# Patient Record
Sex: Male | Born: 1965 | Race: White | Hispanic: No | Marital: Married
Health system: Midwestern US, Community
[De-identification: ages and names within clinical notes are randomized; demographics above are authoritative.]

## PROBLEM LIST (undated history)

## (undated) DIAGNOSIS — M23304 Other meniscus derangements, unspecified medial meniscus, left knee: Secondary | ICD-10-CM

## (undated) DIAGNOSIS — Z79899 Other long term (current) drug therapy: Secondary | ICD-10-CM

## (undated) DIAGNOSIS — S83242D Other tear of medial meniscus, current injury, left knee, subsequent encounter: Secondary | ICD-10-CM

## (undated) DIAGNOSIS — R251 Tremor, unspecified: Secondary | ICD-10-CM

---

## 2017-01-14 NOTE — Other (Signed)
[facility] [address] [place] [id number] [phone]      [facility] RECORD    Patient Name: George Norman , George Norman Medical Record Number: [id number]   Account Number: [id number] DOB: 06 / 09 / 1967   Admit Date: 02 / 25 / 2018 Discharge Date: / /         The patient is a 51 year old male who comes in chief complaint of left ear pain.  The patient states that the pain started this afternoon with an acute onset.  He denies any previous upper respiratory tract symptoms.  He denies any fever, chills, or night sweats.  He has been out snow shoveling today.    PAST MEDICAL HISTORY:  Significant for no known drug allergies; currently taking no medications.    OBJECTIVE:  VITAL SIGNS:  Temperature 36.9, pulse 57, respirations 20, blood pressure 111/68, pulse ox 99 percent.   HEENT:  TMs are intact.  They are nonerythematous and non-bulging.  He is mildly tender along the insertion of the sternocleidomastoid on the left right at the mastoid process.  He has tenderness with lateral rotation, as well as with lateral flexion.  Posterior pharynx is not injected.     NECK:  Otherwise supple.   LUNGS:  Clear to auscultation in all fields.   HEART:  Has a regular rate and rhythm with no murmurs, thrills, rubs, or gallops.    ASSESSMENT:  Musculoskeletal strain at the sternocleidomastoid at the left mastoid process.    PLAN:  At this time, I will provide him with some etodolac 300 mg, 1 tablet 3 times daily for 5 days.  The patient was advised regarding neck stretching exercises, to expect a gradual decrease in pain over the next 3 to 4 days.  The patient understood those instructions.          ____________________________________  Carmell AustriaJames H. Mycala Warshawsky, MD    JK/MODL  D:  01/14/2017 18:36:38  T:  01/14/2017 18:46:57  MModal Job#: [id number]  Doc#:  [id number]    CC: Doug SouYAN GILBERT  Electronically Authenticated by:  Gearldine ShownJames Keshia Weare, MD On 01/23/2017 09:33 AM EST

## 2018-01-24 ENCOUNTER — Encounter

## 2018-01-24 ENCOUNTER — Inpatient Hospital Stay: Admit: 2018-01-24 | Payer: MEDICARE

## 2018-01-24 DIAGNOSIS — R251 Tremor, unspecified: Secondary | ICD-10-CM

## 2018-01-24 LAB — CBC WITH AUTOMATED DIFF
ABS. BASOPHILS: 0 10*3/uL (ref 0.0–0.1)
ABS. EOSINOPHILS: 0.1 10*3/uL (ref 0.0–0.5)
ABS. IMM. GRANS.: 0 10*3/uL (ref 0–0.03)
ABS. LYMPHOCYTES: 1.8 10*3/uL (ref 1.2–3.7)
ABS. MONOCYTES: 0.6 10*3/uL (ref 0.2–0.8)
ABS. NEUTROPHILS: 3.8 10*3/uL (ref 1.6–6.1)
BASOPHILS: 1 % (ref 0–1.2)
EOSINOPHILS: 1 %
HCT: 44.4 % (ref 40.1–51.0)
HGB: 15.1 g/dL (ref 13.7–17.5)
IMMATURE GRANULOCYTES: 0 % (ref 0–0.4)
LYMPHOCYTES: 28 %
MCH: 32.8 PG — ABNORMAL HIGH (ref 25.6–32.2)
MCHC: 34 g/dL (ref 32.2–35.5)
MCV: 96.3 FL — ABNORMAL HIGH (ref 80.0–95.0)
MONOCYTES: 10 %
MPV: 11 FL (ref 9.4–12.4)
NEUTROPHILS: 60 %
PLATELET: 147 10*3/uL — ABNORMAL LOW (ref 150–400)
RBC: 4.61 M/uL (ref 4.51–5.93)
RDW: 12.8 % (ref 11.6–14.4)
WBC: 6.2 10*3/uL (ref 4.0–10.0)

## 2018-01-24 LAB — METABOLIC PANEL, COMPREHENSIVE
A-G Ratio: 1 (ref 1.0–3.0)
ALT (SGPT): 28 U/L (ref 16–61)
AST (SGOT): 17 U/L (ref 15–37)
Albumin: 3.6 g/dL (ref 3.4–5.0)
Alk. phosphatase: 59 U/L (ref 45–117)
Anion gap: 4 mmol/L — ABNORMAL LOW (ref 5–15)
BUN/Creatinine ratio: 31 — ABNORMAL HIGH (ref 12–20)
BUN: 25 MG/DL — ABNORMAL HIGH (ref 7–18)
Bilirubin, total: 0.56 mg/dL (ref 0.20–1.20)
CO2: 30 mmol/L (ref 21–32)
Calcium: 8.9 MG/DL (ref 8.5–10.1)
Chloride: 105 mmol/L (ref 98–110)
Creatinine: 0.81 MG/DL (ref 0.70–1.30)
GFR est AA: >60 mL/min/{1.73_m2} (ref >60)
GFR est non-AA: >60 mL/min/{1.73_m2} (ref >60)
Globulin: 3.6 g/dL — ABNORMAL HIGH (ref 2.5–3.5)
Glucose: 92 mg/dL (ref 70–100)
Potassium: 4.3 mmol/L (ref 3.5–5.1)
Protein, total: 7.2 g/dL (ref 6.4–8.2)
Sodium: 139 mmol/L (ref 136–145)

## 2018-01-24 LAB — URINALYSIS W/ RFLX MICROSCOPIC
Bilirubin: NEGATIVE
Blood: NEGATIVE
Glucose: NEGATIVE mg/dL
Ketone: NEGATIVE mg/dL
Leukocyte Esterase: NEGATIVE
Nitrites: NEGATIVE
Protein: NEGATIVE mg/dL
Specific gravity: 1.027 (ref 1.001–1.035)
Urobilinogen: 2 EU/dL — ABNORMAL HIGH (ref 0.2–1.0)
pH (UA): 7 (ref 5–8)

## 2018-01-24 LAB — SED RATE, AUTOMATED: Sed rate, automated: <1 MM/HR (ref 0–15)

## 2018-01-24 LAB — C REACTIVE PROTEIN, QT: C-Reactive protein: <0.29 mg/dL (ref <0.30)

## 2018-01-24 LAB — AMMONIA: Ammonia, plasma: 12 umol/L (ref 11–32)

## 2018-01-24 LAB — T4, FREE: T4, Free: 1.2 ng/dL (ref 0.76–1.46)

## 2018-01-24 LAB — FOLATE: Folate: >20 ng/mL — ABNORMAL HIGH (ref 3.1–17.5)

## 2018-01-24 LAB — VITAMIN B12: Vitamin B12: 1091 pg/mL — ABNORMAL HIGH (ref 211.0–911.0)

## 2018-01-24 LAB — TSH 3RD GENERATION: TSH, 3rd generation: 3.54 u[IU]/mL (ref 0.358–3.740)

## 2018-01-24 LAB — VITAMIN D, 25 HYDROXY: VITAMIN D, 25-HYDROXY TOTAL: 36.4 ng/mL

## 2018-01-24 LAB — VALPROIC ACID: Valproic acid: 126 ug/ml — ABNORMAL HIGH (ref 50.0–100.0)

## 2018-01-24 LAB — RHEUMATOID FACTOR, QT: Rheumatoid factor: <10 IU/mL (ref <15)

## 2018-01-25 LAB — LYME DISEASE SEROLOGY EVALUATION(ELISA): LYME DISEASE SEROLOGY: NEGATIVE

## 2018-01-26 LAB — VITAMIN B1, WHOLE BLOOD: VITAMIN B1, WHOLE BLOOD: 176 nmol/L (ref 70–180)

## 2018-01-28 LAB — PYRIDOXAL 5-PHOSPHATE: Pyridoxal 5-Phosphate (PLP): 6 mcg/L (ref 5–50)

## 2018-01-28 LAB — METHYLMALONIC ACID: METHYLMALONIC ACID, SERUM: 0.14 nmol/mL (ref <=0.40)

## 2018-01-29 LAB — ANTINUCLEAR AB (ANA): ANA: NEGATIVE

## 2018-02-07 ENCOUNTER — Inpatient Hospital Stay: Admit: 2018-02-07 | Payer: MEDICARE

## 2018-02-07 ENCOUNTER — Encounter

## 2018-02-07 DIAGNOSIS — Z79899 Other long term (current) drug therapy: Secondary | ICD-10-CM

## 2018-02-07 LAB — METABOLIC PANEL, COMPREHENSIVE
A-G Ratio: 0.9 — ABNORMAL LOW (ref 1.0–3.0)
ALT (SGPT): 28 U/L (ref 16–61)
AST (SGOT): 17 U/L (ref 15–37)
Albumin: 3.5 g/dL (ref 3.4–5.0)
Alk. phosphatase: 58 U/L (ref 45–117)
Anion gap: 5 mmol/L (ref 5–15)
BUN/Creatinine ratio: 29 — ABNORMAL HIGH (ref 12–20)
BUN: 23 MG/DL — ABNORMAL HIGH (ref 7–18)
Bilirubin, total: 0.5 mg/dL (ref 0.20–1.20)
CO2: 32 mmol/L (ref 21–32)
Calcium: 9.1 MG/DL (ref 8.5–10.1)
Chloride: 103 mmol/L (ref 98–110)
Creatinine: 0.79 MG/DL (ref 0.70–1.30)
GFR est AA: >60 mL/min/{1.73_m2} (ref >60)
GFR est non-AA: >60 mL/min/{1.73_m2} (ref >60)
Globulin: 3.7 g/dL — ABNORMAL HIGH (ref 2.5–3.5)
Glucose: 87 mg/dL (ref 70–100)
Potassium: 4.4 mmol/L (ref 3.5–5.1)
Protein, total: 7.2 g/dL (ref 6.4–8.2)
Sodium: 140 mmol/L (ref 136–145)

## 2018-02-07 LAB — CBC WITH AUTOMATED DIFF
ABS. BASOPHILS: 0.1 10*3/uL (ref 0.0–0.1)
ABS. EOSINOPHILS: 0.1 10*3/uL (ref 0.0–0.5)
ABS. IMM. GRANS.: 0.1 10*3/uL — ABNORMAL HIGH (ref 0–0.03)
ABS. LYMPHOCYTES: 2.2 10*3/uL (ref 1.2–3.7)
ABS. MONOCYTES: 0.7 10*3/uL (ref 0.2–0.8)
ABS. NEUTROPHILS: 3.3 10*3/uL (ref 1.6–6.1)
BASOPHILS: 1 % (ref 0–1.2)
EOSINOPHILS: 2 %
HCT: 44.7 % (ref 40.1–51.0)
HGB: 14.8 g/dL (ref 13.7–17.5)
IMMATURE GRANULOCYTES: 1 % — ABNORMAL HIGH (ref 0–0.4)
LYMPHOCYTES: 34 %
MCH: 31.8 PG (ref 25.6–32.2)
MCHC: 33.1 g/dL (ref 32.2–35.5)
MCV: 96.1 FL — ABNORMAL HIGH (ref 80.0–95.0)
MONOCYTES: 11 %
MPV: 10.8 FL (ref 9.4–12.4)
NEUTROPHILS: 51 %
PLATELET: 177 10*3/uL (ref 150–400)
RBC: 4.65 M/uL (ref 4.51–5.93)
RDW: 13 % (ref 11.6–14.4)
WBC: 6.4 10*3/uL (ref 4.0–10.0)

## 2018-02-07 LAB — VALPROIC ACID
Reported dose time:: 2330
Valproic acid: 84 ug/ml (ref 50.0–100.0)

## 2018-03-24 ENCOUNTER — Inpatient Hospital Stay
Admit: 2018-03-24 | Discharge: 2018-03-24 | Disposition: A | Discharge status: Other Acute Inpatient Hospital | Payer: MEDICARE | Attending: Registered"

## 2018-03-24 ENCOUNTER — Inpatient Hospital Stay: Admit: 2018-03-24 | Payer: MEDICARE | Attending: Registered"

## 2018-03-24 ENCOUNTER — Inpatient Hospital Stay
Admit: 2018-03-24 | Discharge: 2018-03-25 | Disposition: A | Discharge status: Home / Self Care | Payer: MEDICARE | Attending: Emergency Medicine

## 2018-03-24 DIAGNOSIS — J069 Acute upper respiratory infection, unspecified: Secondary | ICD-10-CM

## 2018-03-24 LAB — CBC WITH AUTOMATED DIFF
ABS. BASOPHILS: 0 10*3/uL (ref 0.0–0.1)
ABS. EOSINOPHILS: 0.1 10*3/uL (ref 0.0–0.5)
ABS. IMM. GRANS.: 0 10*3/uL (ref 0.00–0.03)
ABS. LYMPHOCYTES: 1.9 10*3/uL (ref 1.2–3.7)
ABS. MONOCYTES: 0.7 10*3/uL (ref 0.2–0.8)
ABS. NEUTROPHILS: 3.7 10*3/uL (ref 1.6–6.1)
BASOPHILS: 1 % (ref 0–1.2)
EOSINOPHILS: 1 %
HCT: 39.3 % — ABNORMAL LOW (ref 40.1–51.0)
HGB: 13.2 g/dL — ABNORMAL LOW (ref 13.7–17.5)
IMMATURE GRANULOCYTES: 0 % (ref 0.0–0.4)
LYMPHOCYTES: 30 %
MCH: 31.5 PG (ref 25.6–32.2)
MCHC: 33.6 g/dL (ref 32.2–35.5)
MCV: 93.8 FL (ref 80.0–95.0)
MONOCYTES: 11 %
MPV: 10 FL (ref 9.4–12.4)
NEUTROPHILS: 57 %
PLATELET: 189 10*3/uL (ref 150–400)
RBC: 4.19 M/uL — ABNORMAL LOW (ref 4.51–5.93)
RDW: 11.9 % (ref 11.6–14.4)
WBC: 6.4 10*3/uL (ref 4.0–10.0)

## 2018-03-24 LAB — METABOLIC PANEL, COMPREHENSIVE
A-G Ratio: 1.1 (ref 1.0–3.0)
ALT (SGPT): 29 U/L (ref 16–61)
AST (SGOT): 22 U/L (ref 15–37)
Albumin: 3.4 g/dL (ref 3.4–5.0)
Alk. phosphatase: 65 U/L (ref 45–117)
Anion gap: 7 mmol/L (ref 5–15)
BUN/Creatinine ratio: 33 — ABNORMAL HIGH (ref 12–20)
BUN: 23 MG/DL — ABNORMAL HIGH (ref 7–18)
Bilirubin, total: 0.33 mg/dL (ref 0.20–1.20)
CO2: 30 mmol/L (ref 21–32)
Calcium: 8.4 MG/DL — ABNORMAL LOW (ref 8.5–10.1)
Chloride: 104 mmol/L (ref 98–110)
Creatinine: 0.69 MG/DL — ABNORMAL LOW (ref 0.70–1.30)
GFR est AA: >60 mL/min/{1.73_m2} (ref >60)
GFR est non-AA: >60 mL/min/{1.73_m2} (ref >60)
Globulin: 3.2 g/dL (ref 2.5–3.5)
Glucose: 87 mg/dL (ref 70–100)
Potassium: 3.6 mmol/L (ref 3.5–5.1)
Protein, total: 6.6 g/dL (ref 6.4–8.2)
Sodium: 141 mmol/L (ref 136–145)

## 2018-03-24 LAB — UA WITH REFLEX MICRO AND CULTURE
Blood: NEGATIVE
Glucose: NEGATIVE mg/dL
Leukocyte Esterase: NEGATIVE
Nitrites: NEGATIVE
Protein: NEGATIVE mg/dL
Specific gravity: 1.033 (ref 1.001–1.035)
Urobilinogen: 1 EU/dL (ref 0.2–1.0)
pH (UA): 5.5 (ref 5–8)

## 2018-03-24 LAB — TROPONIN I: Troponin-I, Qt.: <0.015 ng/mL (ref 0.000–0.040)

## 2018-03-24 NOTE — ED Provider Notes (Addendum)
This 52 year old white male was referred to the emergency room from the [facility] for further evaluation of his coughing and abdominal discomfort.  The patient and his wife have had an upper respiratory tract infection over the last 7-9 days with coughing as well as nasal congestion and some frontal headaches.  The patient states every other day after eating a heavy meal he will have some lower infra umbilical abdominal discomfort leading to a bout of vomiting which resolved his pain. He has no abdominal pains now.  He is without chest pain, pleurisy, fevers, hemoptysis, black or bloody stools, hematuria or wheezing.  He does not smoke.  He occasionally drinks alcohol.  His weight is stable.  He is denying calf pain or swelling.  He is without orthopnea.  He denies a sore throat.           Past Medical History:   Diagnosis Date   ??? Seizures (HCC)        Past Surgical History:   Procedure Laterality Date   ??? HX OTHER SURGICAL      right hand         No family history on file.    Social History     Socioeconomic History   ??? Marital status: MARRIED     Spouse name: Not on file   ??? Number of children: Not on file   ??? Years of education: Not on file   ??? Highest education level: Not on file   Occupational History   ??? Not on file   Social Needs   ??? Financial resource strain: Not on file   ??? Food insecurity:     Worry: Not on file     Inability: Not on file   ??? Transportation needs:     Medical: Not on file     Non-medical: Not on file   Tobacco Use   ??? Smoking status: Never Smoker   ??? Smokeless tobacco: Never Used   Substance and Sexual Activity   ??? Alcohol use: Yes     Comment: occassionally   ??? Drug use: Never   ??? Sexual activity: Not on file   Lifestyle   ??? Physical activity:     Days per week: Not on file     Minutes per session: Not on file   ??? Stress: Not on file   Relationships   ??? Social connections:     Talks on phone: Not on file     Gets together: Not on file      Attends religious service: Not on file     Active member of club or organization: Not on file     Attends meetings of clubs or organizations: Not on file     Relationship status: Not on file   ??? Intimate partner violence:     Fear of current or ex partner: Not on file     Emotionally abused: Not on file     Physically abused: Not on file     Forced sexual activity: Not on file   Other Topics Concern   ??? Not on file   Social History Narrative   ??? Not on file         ALLERGIES: Patient has no known allergies.    Review of Systems   Constitutional: Negative.  Negative for fever.   HENT: Positive for postnasal drip, rhinorrhea and sinus pressure. Negative for sore throat, trouble swallowing and voice change.    Eyes:  Negative.    Respiratory: Positive for cough. Negative for chest tightness, shortness of breath and wheezing.    Gastrointestinal: Positive for abdominal pain, nausea and vomiting. Negative for blood in stool and diarrhea.   Endocrine: Negative.    Genitourinary: Negative.    Musculoskeletal: Negative.    Allergic/Immunologic: Negative.    Neurological: Negative.    Hematological: Negative.    Psychiatric/Behavioral: Negative.    All other systems reviewed and are negative.      Vitals:    03/24/18 1800   BP: 122/73   Pulse: (!) 47   Resp: 14   Temp: 97.9 ??F (36.6 ??C)   SpO2: 98%   Weight: 73.9 kg (163 lb)   Height:  (1.803 m)            Physical Exam   Constitutional: He is oriented to person, place, and time. He appears well-developed and well-nourished.  Non-toxic appearance. He does not appear ill.   HENT:   Head: Normocephalic and atraumatic.   Mouth/Throat: Oropharynx is clear and moist.   Eyes: Pupils are equal, round, and reactive to light. EOM are normal.   Cardiovascular: Normal rate, regular rhythm, normal heart sounds and intact distal pulses.   Pulmonary/Chest: Effort normal and breath sounds normal.   Abdominal: Soft. Normal appearance, normal aorta and bowel sounds are  normal. There is no splenomegaly or hepatomegaly. There is no tenderness. There is no rigidity, no rebound and no guarding. No hernia.   Neurological: He is alert and oriented to person, place, and time.   Skin: Skin is warm and dry. Capillary refill takes less than 2 seconds. No rash noted.   Psychiatric: He has a normal mood and affect. His behavior is normal.   Nursing note and vitals reviewed.       MDM  Number of Diagnoses or Management Options  Non-intractable vomiting without nausea, unspecified vomiting type:   Viral upper respiratory tract infection with cough:   Diagnosis management comments: This otherwise healthy 52 year old white male who is a nonsmoker developed an upper respiratory tract infection about 8 or 9 days ago with some coughing.  The patient states every other day he has had a bout of vomiting with some lower abdominal discomfort that would quickly go away after vomiting.  Next day he would be fine before he would again vomit after having a meal.  The patient has no abdominal tenderness at this time and he has had no fevers.  The patient has CBC and chemistry panel results are unremarkable.  His chest x-ray and EKG are unremarkable.  The patient understands his viral upper respiratory tract infection is self-limited should go away early this week.  He is to follow up with his PCP or return if having any worsening abdominal pains, fevers, black or bloody stools, coffee-ground emesis, etc.       Amount and/or Complexity of Data Reviewed  Clinical lab tests: reviewed      ED Course as of Mar 24 1940   Sun Mar 24, 2018   1817 The PA and lateral chest x-ray performed at the [facility] was reviewed and no acute cardiopulmonary pathology was noted per my read.    [PG]   [id number] The patient had an EKG at the [facility] showing sinus bradycardia 47 beats per minute with early R-wave transition.  No acute ST or T-wave changes are noted.    [PG]      ED Course User Index   [  PG] Sharilyn Sites, DO       Procedures  NIH Stroke Scale          ICD-10-CM ICD-9-CM   1. Viral upper respiratory tract infection with cough J06.9 465.9    B97.89    2. Non-intractable vomiting without nausea, unspecified vomiting type R11.11 787.03

## 2018-03-24 NOTE — Other (Signed)
Patient states of constant cough, bilateral rib pain, vomit on and off and abdominal pain for 8 days now with no improvment

## 2018-03-24 NOTE — ED Notes (Signed)
Discharge instructions reviewed with pt and pt verbalized understanding, denies any questions. All belongings with pt and pt ambulatory out to front for ride home with family member.

## 2018-03-24 NOTE — ED Notes (Signed)
Pt ambulatory to bathroom to void for urine sample without issue.

## 2018-03-24 NOTE — Other (Signed)
52 year old man presents with cough, ribcage pain, intermittent nausea/vomiting and vague abdominal pain for the past week.  He was working outside in the rain with a friend last weekend prior to developing a cough.  The ribcage pain developed shortly thereafter and is worst bilaterally on the midaxillary lines.  He has no overt chest pain or difficulty breathing or dyspnea on exertion.  His cough is not productive.  He has had no fevers or myalgias.  No exposure to the flu is aware of.  Monday morning the patient had nausea and vomiting after a heavy meal the night before.  He reports having nausea and vomiting approximately every other day after having heavy meals, though he is not specific about what he ate.  He has abdominal pain that resolves after vomiting, though he has continued with a nonfocal abdominal pain to palpation. He is currently nausea free.  No diarrhea, constipation blood in his stools or hematuria.  No headaches, weakness/numbness/tingling of his extremities. Patient denies a history of hypertension, hyperlipidemia, diabetes and also denies tobacco history or alcohol intake.  Patient's father had several heart attacks.    The history is provided by the patient.        Past Medical History:   Diagnosis Date   ??? Seizures (HCC)         Past Surgical History:   Procedure Laterality Date   ??? HX OTHER SURGICAL      right hand         History reviewed. No pertinent family history.     Social History     Socioeconomic History   ??? Marital status: MARRIED     Spouse name: Not on file   ??? Number of children: Not on file   ??? Years of education: Not on file   ??? Highest education level: Not on file   Occupational History   ??? Not on file   Social Needs   ??? Financial resource strain: Not on file   ??? Food insecurity:     Worry: Not on file     Inability: Not on file   ??? Transportation needs:     Medical: Not on file     Non-medical: Not on file   Tobacco Use   ??? Smoking status: Never Smoker    ??? Smokeless tobacco: Never Used   Substance and Sexual Activity   ??? Alcohol use: Yes     Comment: occassionally   ??? Drug use: Never   ??? Sexual activity: Not on file   Lifestyle   ??? Physical activity:     Days per week: Not on file     Minutes per session: Not on file   ??? Stress: Not on file   Relationships   ??? Social connections:     Talks on phone: Not on file     Gets together: Not on file     Attends religious service: Not on file     Active member of club or organization: Not on file     Attends meetings of clubs or organizations: Not on file     Relationship status: Not on file   ??? Intimate partner violence:     Fear of current or ex partner: Not on file     Emotionally abused: Not on file     Physically abused: Not on file     Forced sexual activity: Not on file   Other Topics Concern   ??? Not on file  Social History Narrative   ??? Not on file                ALLERGIES: Patient has no known allergies.    Review of Systems   Constitutional: Negative for appetite change, chills, fever and unexpected weight change.   HENT: Negative for congestion, sore throat and trouble swallowing.    Eyes: Negative for visual disturbance.   Respiratory: Positive for cough. Negative for shortness of breath and wheezing.    Cardiovascular: Positive for chest pain. Negative for palpitations and leg swelling.   Gastrointestinal: Positive for abdominal pain, nausea and vomiting. Negative for blood in stool, constipation and diarrhea.   Genitourinary: Negative for dysuria.   Musculoskeletal: Negative for back pain and myalgias.   Skin: Negative for rash.   Neurological: Negative for weakness, light-headedness, numbness and headaches.   Psychiatric/Behavioral: The patient is not nervous/anxious.    All other systems reviewed and are negative.      Vitals:    03/24/18 1620   BP: 115/59   Pulse: (!) 52   Resp: 20   Temp: 98.1 ??F (36.7 ??C)   SpO2: 97%       Physical Exam    Constitutional: He is oriented to person, place, and time. No distress.   HENT:   Head: Normocephalic and atraumatic.   Mouth/Throat: Oropharynx is clear and moist. No oropharyngeal exudate.   Eyes: Pupils are equal, round, and reactive to light. Conjunctivae and EOM are normal. No scleral icterus.   Neck: Normal range of motion. Neck supple. No JVD present. No tracheal deviation present.   Cardiovascular: Normal rate, regular rhythm, normal heart sounds and intact distal pulses.   No murmur heard.  Pulmonary/Chest: Effort normal. No respiratory distress. He exhibits tenderness.   Lung sounds are diminished throughout, no rhonchi or wheezes are appreciated.  Patient has chest wall tenderness reproducible to palpation bilaterally on the midaxillary lines.   Abdominal: Soft. He exhibits no distension. There is tenderness.   Nonfocal abdominal pain. Increased pain with Murphy sign.   Musculoskeletal: Normal range of motion. He exhibits no edema.   Lymphadenopathy:     He has no cervical adenopathy.   Neurological: He is alert and oriented to person, place, and time. Coordination normal.   Skin: Skin is warm and dry. No rash noted.   Psychiatric: He has a normal mood and affect.   Nursing note and vitals reviewed.      MDM  Number of Diagnoses or Management Options  Abdominal pain, generalized:   Cough:   Nausea and vomiting, intractability of vomiting not specified, unspecified vomiting type:   Diagnosis management comments: 52 year old gentleman with cough, chest wall pain, vague abdominal pain with intermittent nausea vomiting over the past week.  I will obtain an EKG and chest x-ray for the patient.  Differential diagnosis at this time includes upper respiratory illness versus pneumonia.   I have a low suspicion of ACS given the patient's lack of risk factors.   I do not suspect that the patient has the flu.  Regarding the patient's nausea/vomiting and abdominal pain, differential  includes gallbladder disease versus gastroenteritis.  I spoke with the patient about transfer to the emergency room for further evaluation with labs and possibly ultrasound or CT scan as well as a troponin check, to which the patient and his wifeAre agreeable.  I will obtain the EKG and chest x-ray here to start the patient's workup and then he will be going to the  [facility] Emergency room by private vehicle.  I feel like this is reasonable.    EKG is sinus bradycardia, heart rate 47, possible LVH, peaked T-waves in V2 through V4, no QTC prolongation.  Chest x-ray reviewed by me in the urgent care shows possible early infiltrate in the bilateral lower lobes, no effusion, pneumothorax or widened mediastinum.    Results were discussed with the patient and his wife at bedside.  I encouraged the patient to proceed to the [facility] emergency room by personal vehicle now.  Patient and his wife verbalized understanding and agreement with this treatment plan.  I have called ahead to the emergency room and spoken with one of the nurses to give report on the patient.             EKG  Date/Time: 03/24/2018 5:11 PM  Performed by: Betti Cruz, PA  Authorized by: Betti Cruz, PA     ECG reviewed by ED Physician in the absence of a cardiologist: yes    Previous ECG:     Previous ECG:  Unavailable  Rate:     ECG rate:  47  Comments:      Sinus bradycardia with possible LVH, peaked T-waves in V2 through V4, no QTC prolongation.            ICD-10-CM ICD-9-CM   1. Cough R05 786.2   2. Abdominal pain, generalized R10.84 789.07   3. Nausea and vomiting, intractability of vomiting not specified, unspecified vomiting type R11.2 787.01

## 2018-03-25 LAB — EKG 12-LEAD
ECG QTC Interval: 446 ms
EKG QRS AXIS: 45 deg
EKG QRS INTERVAL: 92 ms
EKG QTC INTERVAL: 395 ms
EKG T WAVE AXIS: 43 deg
Heart Rate: 47 {beats}/min
P Axis: 46 deg
P-R Interval: 132 msec

## 2018-03-25 LAB — EKG, 12 LEAD, INITIAL
Heart Rate: 47 {beats}/min
P Axis: 46 deg
P-R Interval: 132 msec
QRS Axis: 45 deg
QRS Interval: 92 ms
QT Interval: 446 ms
QTC Interval: 395 ms
T Wave Axis: 43 deg

## 2018-06-11 ENCOUNTER — Inpatient Hospital Stay
Admit: 2018-06-11 | Discharge: 2018-06-11 | Disposition: A | Discharge status: Home / Self Care | Payer: MEDICARE | Attending: Family Medicine

## 2018-06-11 ENCOUNTER — Inpatient Hospital Stay: Admit: 2018-06-11 | Payer: MEDICARE | Attending: Family Medicine

## 2018-06-11 DIAGNOSIS — M25562 Pain in left knee: Secondary | ICD-10-CM

## 2018-06-11 MED ORDER — DICLOFENAC 75 MG TAB, DELAYED RELEASE
75 mg | ORAL_TABLET | Freq: Two times a day (BID) | ORAL | 0 refills | Status: DC
Start: 2018-06-11 — End: 2018-10-07

## 2018-06-11 NOTE — Other (Addendum)
Patient presents with:  Knee Pain: L knee pain for 1 1/2 month.  Denies trauma.  No swelling.   Pain aroung lower border of patella. 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 The current episode started more than 1 week ago (1.5 mos). The problem has not changed (8 - 10/10 constantly) since onset.He has tried nothing for the symptoms. There has been no history of extremity trauma.        Past Medical History:   Diagnosis Date   ??? Seizures (HCC)         Past Surgical History:   Procedure Laterality Date   ??? HX OTHER SURGICAL      right hand         History reviewed. No pertinent family history.     Social History     Socioeconomic History   ??? Marital status: MARRIED     Spouse name: Not on file   ??? Number of children: Not on file   ??? Years of education: Not on file   ??? Highest education level: Not on file   Occupational History   ??? Not on file   Social Needs   ??? Financial resource strain: Not on file   ??? Food insecurity:     Worry: Not on file     Inability: Not on file   ??? Transportation needs:     Medical: Not on file     Non-medical: Not on file   Tobacco Use   ??? Smoking status: Never Smoker   ??? Smokeless tobacco: Never Used   Substance and Sexual Activity   ??? Alcohol use: Yes     Comment: occassionally   ??? Drug use: Never   ??? Sexual activity: Not on file   Lifestyle   ??? Physical activity:     Days per week: Not on file     Minutes per session: Not on file   ??? Stress: Not on file   Relationships   ??? Social connections:     Talks on phone: Not on file     Gets together: Not on file     Attends religious service: Not on file     Active member of club or organization: Not on file     Attends meetings of clubs or organizations: Not on file     Relationship status: Not on file   ??? Intimate partner violence:     Fear of current or ex partner: Not on file     Emotionally abused: Not on file      Physically abused: Not on file     Forced sexual activity: Not on file   Other Topics Concern   ??? Not on file   Social History Narrative   ??? Not on file                ALLERGIES: Patient has no known allergies.    Review of Systems   Constitutional: Negative for chills and fever.   Cardiovascular: Negative for leg swelling.       Vitals:    06/11/18 1743   BP: 121/69   Pulse: (!) 54   Resp: 16   Temp: 98.1 ??F (36.7 ??C)   SpO2: 97%       Physical  Exam   Constitutional: He appears well-developed and well-nourished. He does not have a sickly appearance. He does not appear ill. No distress.   Eyes: Right conjunctiva is not injected. Left conjunctiva is not injected.   Musculoskeletal:        Left knee: He exhibits decreased range of motion. He exhibits no swelling, no effusion, no ecchymosis, no deformity, no LCL laxity, normal patellar mobility and no MCL laxity. Tenderness (subjective tenderness all over the knee) found. Medial joint line, lateral joint line, MCL, LCL and patellar tendon tenderness noted.   Neurological: He is alert. Coordination and gait normal.   Skin: No rash noted.   Psychiatric: He has a normal mood and affect. His speech is normal and behavior is normal.       MDM      ICD-10-CM ICD-9-CM   1. Acute pain of left knee M25.562 719.46      X-ray of the left knee PA and lateral is probably negative. There is a crack like appearance to the growth plate which is probably normal. We will await radiology report.    In the meantime start diclofenac 75 mg b.i.d. p.r.n..  Will have referral to [facility] for further assessment and management    All discussed.  [facility] was agreeable to this plan    Addendum-radiologist called and agrees that there is a crack like anomaly in the lateral view of the tibia.  This is not the growth plate.  Could be a bone marrow canal or variant.  In view of the pain he is having in his knee ortho follow-up is appropriate.  MRI may be needed if the pain does  not subside.  This was all discussed with [facility] Maduro who agreed to call [facility] office and get in to see him as soon as possible       Procedures

## 2018-06-11 NOTE — ED Provider Notes (Signed)
Patient presents with:  Knee Pain: L knee pain for 1 1/2 month.  Denies trauma.  No swelling.   Pain aroung lower border of patella. 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 The current episode started more than 1 week ago (1.5 mos). The problem has not changed (8 - 10/10 constantly) since onset.He has tried nothing for the symptoms. There has been no history of extremity trauma.        Past Medical History:   Diagnosis Date   ??? Seizures (HCC)         Past Surgical History:   Procedure Laterality Date   ??? HX OTHER SURGICAL      right hand         History reviewed. No pertinent family history.     Social History     Socioeconomic History   ??? Marital status: MARRIED     Spouse name: Not on file   ??? Number of children: Not on file   ??? Years of education: Not on file   ??? Highest education level: Not on file   Occupational History   ??? Not on file   Social Needs   ??? Financial resource strain: Not on file   ??? Food insecurity:     Worry: Not on file     Inability: Not on file   ??? Transportation needs:     Medical: Not on file     Non-medical: Not on file   Tobacco Use   ??? Smoking status: Never Smoker   ??? Smokeless tobacco: Never Used   Substance and Sexual Activity   ??? Alcohol use: Yes     Comment: occassionally   ??? Drug use: Never   ??? Sexual activity: Not on file   Lifestyle   ??? Physical activity:     Days per week: Not on file     Minutes per session: Not on file   ??? Stress: Not on file   Relationships   ??? Social connections:     Talks on phone: Not on file     Gets together: Not on file     Attends religious service: Not on file     Active member of club or organization: Not on file     Attends meetings of clubs or organizations: Not on file     Relationship status: Not on file   ??? Intimate partner violence:     Fear of current or ex partner: Not on file     Emotionally abused: Not on file     Physically abused:  Not on file     Forced sexual activity: Not on file   Other Topics Concern   ??? Not on file   Social History Narrative   ??? Not on file                ALLERGIES: Patient has no known allergies.    Review of Systems   Constitutional: Negative for chills and fever.   Cardiovascular: Negative for leg swelling.       Vitals:    06/11/18 1743   BP: 121/69   Pulse: (!) 54   Resp: 16   Temp: 98.1 ??F (36.7 ??C)   SpO2: 97%       Physical  Exam   Constitutional: He appears well-developed and well-nourished. He does not have a sickly appearance. He does not appear ill. No distress.   Eyes: Right conjunctiva is not injected. Left conjunctiva is not injected.   Musculoskeletal:        Left knee: He exhibits decreased range of motion. He exhibits no swelling, no effusion, no ecchymosis, no deformity, no LCL laxity, normal patellar mobility and no MCL laxity. Tenderness (subjective tenderness all over the knee) found. Medial joint line, lateral joint line, MCL, LCL and patellar tendon tenderness noted.   Neurological: He is alert. Coordination and gait normal.   Skin: No rash noted.   Psychiatric: He has a normal mood and affect. His speech is normal and behavior is normal.       MDM      ICD-10-CM ICD-9-CM   1. Acute pain of left knee M25.562 719.46      X-ray of the left knee PA and lateral is probably negative. There is a crack like appearance to the growth plate which is probably normal. We will await radiology report.    In the meantime start diclofenac 75 mg b.i.d. p.r.n..  Will have referral to Dr. Elijah Birk ortho for further assessment and management    All discussed.  Bird was agreeable to this plan    Addendum-radiologist called and agrees that there is a crack like anomaly in the lateral view of the tibia.  This is not the growth plate.  Could be a bone marrow canal or variant.  In view of the pain he is having in his knee ortho follow-up is appropriate.  MRI may be needed if the pain does not subside.  This was all discussed  with Molly Maduro who agreed to call Dr. Tasia Catchings office and get in to see him as soon as possible       Procedures

## 2018-06-12 NOTE — Telephone Encounter (Signed)
Patient's wife Stanton Kidney(Debra) called to make a F/U appt for patient for his Lt knee. Was seen in urgent care yesterday (06/11/18).    Please advise where to schedule patient.

## 2018-06-12 NOTE — Telephone Encounter (Signed)
No openings or double bookings for the next two weeks. Please advise what you want to do.

## 2018-06-12 NOTE — Telephone Encounter (Signed)
Would put on for next week with a full set of x-rays.  Perhaps he can see Dr. Elijah Birkom on Wednesday morning 7/31.

## 2018-06-13 NOTE — Telephone Encounter (Signed)
Dr. Elijah Birkom has opened 9-10 on 7/31. Patient can come at 9:15.

## 2018-06-13 NOTE — Telephone Encounter (Signed)
First asvailable I see (other than 8, 10, 1 PM set aside for FX's) is with Dr. Elijah Birkom on 07/01/18 at 11 (double booking).    Is that ok, or to far out?

## 2018-06-14 NOTE — Telephone Encounter (Signed)
Patient scheduled on 06/19/18 at 9:15 AM.

## 2018-06-19 ENCOUNTER — Ambulatory Visit: Admit: 2018-06-19 | Discharge: 2018-06-19 | Payer: MEDICARE | Attending: Orthopaedic Surgery

## 2018-06-19 DIAGNOSIS — M25562 Pain in left knee: Secondary | ICD-10-CM

## 2018-06-19 NOTE — Progress Notes (Signed)
HPI: George Norman is a 52 y.o. male here for new patient evaluation of left knee pain.  Patient has had a 2 month history of atraumatic onset of left knee pain.  He did have a fall the wintertime but did not have immediate pain after that.  Patient is the husband of George Norman who is a knee replacement patient of mine.  He complains primarily of pain over the anterior knee region along the patellofemoral joint and in the peripatellar region. Denies fevers or chills or swelling. Patient was seen at urgent care did have an x-ray there is a question of a potential lucent line but this was thought to be artifact.  Pain is at level 7/10.  Denies locking or catching.  Denies effusion.  Patient does take anti-inflammatories with diclofenac.  No history of injury.      ROS: Denies fevers, chills, nausea, vomiting. Systems negative except for pertinent positives noted in HPI.    There is no problem list on file for this patient.        Current Outpatient Medications:   ???  loratadine (CLARITIN) 10 mg tablet, Take 10 mg by mouth daily., Disp: , Rfl:   ???  diclofenac EC (VOLTAREN) 75 mg EC tablet, Take 1 Tab by mouth two (2) times a day. Take with food, Disp: 30 Tab, Rfl: 0  ???  divalproex ER (DEPAKOTE ER) 250 mg ER tablet, 1,000 mg nightly., Disp: , Rfl: 4  ???  DAILY-VITE tablet, TAKE 1 TABLET BY MOUTH DAILY, Disp: , Rfl: 11    No Known Allergies    Physical Exam:  General: well appearing male in no acute distress    Musculoskeletal:   Physical exam left knee:  No evidence of effusion.  Skin is intact.  Patient has full extension, flexion 130??.  Patient does have tenderness to palpation at the quadriceps tendon and at the patella tendon.  Discomfort patellofemoral grind testing.  Mild tenderness palpation at the medial joint line.  Mild discomfort with medial McMurray's and circumduction maneuvers.  No significant lateral joint line tenderness. Stable exam ACL, PCL, LCL, MCL.  No pain with hip rotation.   Soft compartments and calf.  No distal edema. Antalgic gait with ambulation.    Radi[facility]: [facility]  No definitive acute findings, review lucent line overlying the femoral condyle is probable artifact     Assessment/Plan: George Norman is a 52 y.o. male with 2 month history of left knee pain at high levels 7/10.  No improvement with time, anti-inflammatories.  X-rays do not show significant osteoarthritis.  Patient does have pain in the patellofemoral joint region primarily as well as a component of joint line pain.  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 Patient instructed to call to schedule a follow-up appointment after the MRI has been scheduled for review definitive treatment plan.  1.  Activity modification, avoid painful in aggravating activities.  2.  Anti-inflammatories and/or analgesics as needed.  3.  All questions and concerns have been answered, patient is agreeable with this plan, call with any questions or concerns.

## 2018-06-19 NOTE — Progress Notes (Signed)
HPI: George Norman is a 52 y.o. male here for new patient evaluation of left knee pain.  Patient has had a 2 month history of atraumatic onset of left knee pain.  He did have a fall the wintertime but did not have immediate pain after that.  Patient is the husband of George Norman who is a knee replacement patient of mine.  He complains primarily of pain over the anterior knee region along the patellofemoral joint and in the peripatellar region. Denies fevers or chills or swelling. Patient was seen at urgent care did have an x-ray there is a question of a potential lucent line but this was thought to be artifact.  Pain is at level 7/10.  Denies locking or catching.  Denies effusion.  Patient does take anti-inflammatories with diclofenac.  No history of injury.      ROS: Denies fevers, chills, nausea, vomiting. Systems negative except for pertinent positives noted in HPI.    There is no problem list on file for this patient.        Current Outpatient Medications:   ???  loratadine (CLARITIN) 10 mg tablet, Take 10 mg by mouth daily., Disp: , Rfl:   ???  diclofenac EC (VOLTAREN) 75 mg EC tablet, Take 1 Tab by mouth two (2) times a day. Take with food, Disp: 30 Tab, Rfl: 0  ???  divalproex ER (DEPAKOTE ER) 250 mg ER tablet, 1,000 mg nightly., Disp: , Rfl: 4  ???  DAILY-VITE tablet, TAKE 1 TABLET BY MOUTH DAILY, Disp: , Rfl: 11    No Known Allergies    Physical Exam:  General: well appearing male in no acute distress    Musculoskeletal:   Physical exam left knee:  No evidence of effusion.  Skin is intact.  Patient has full extension, flexion 130??.  Patient does have tenderness to palpation at the quadriceps tendon and at the patella tendon.  Discomfort patellofemoral grind testing.  Mild tenderness palpation at the medial joint line.  Mild discomfort with medial McMurray's and circumduction maneuvers.  No significant lateral joint line tenderness. Stable exam ACL, PCL, LCL, MCL.  No pain with hip rotation.  Soft compartments  and calf.  No distal edema. Antalgic gait with ambulation.    R[facility]: [facility]  No definitive acute findings, review lucent line overlying the femoral condyle is probable artifact     Assessment/Plan: George Norman is a 52 y.o. male with 2 month history of left knee pain at high levels 7/10.  No improvement with time, anti-inflammatories.  X-rays do not show significant osteoarthritis.  Patient does have pain in the patellofemoral joint region primarily as well as a component of joint line pain.  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 Patient instructed to call to schedule a follow-up appointment after the MRI has been scheduled for review definitive treatment plan.  1.  Activity modification, avoid painful in aggravating activities.  2.  Anti-inflammatories and/or analgesics as needed.  3.  All questions and concerns have been answered, patient is agreeable with this plan, call with any questions or concerns.

## 2018-06-25 ENCOUNTER — Inpatient Hospital Stay: Admit: 2018-06-25 | Payer: MEDICARE | Attending: Orthopaedic Surgery

## 2018-06-25 DIAGNOSIS — M25562 Pain in left knee: Secondary | ICD-10-CM

## 2018-07-04 ENCOUNTER — Ambulatory Visit: Attending: Orthopaedic Surgery | Primary: Family

## 2018-07-04 ENCOUNTER — Ambulatory Visit: Admit: 2018-07-04 | Discharge: 2018-07-04 | Payer: MEDICARE | Attending: Orthopaedic Surgery

## 2018-07-04 DIAGNOSIS — S83242D Other tear of medial meniscus, current injury, left knee, subsequent encounter: Secondary | ICD-10-CM

## 2018-07-04 NOTE — Progress Notes (Signed)
HPI: George Norman is a 52 y.o. male here for follow-up evaluation of left knee MRI.  Patient has had a 2 month history of atraumatic onset left knee pain.  Pain is over the anterior knee region as well as at the medial joint line.  He did have a fall in the winter time but did not have immediate pain after that.  No fevers or chills.  He did present to Urgent Care where he did have x-rays that did not show evidence of osteoarthritis.  Pain level is 7/10.  Denies locking or catching.  Denies effusion. Patient does take diclofenac.  No history of injury.      ROS: Denies fevers, chills, nausea, vomiting. Systems negative except for pertinent positives noted in HPI.    There is no problem list on file for this patient.        Current Outpatient Medications:   ???  loratadine (CLARITIN) 10 mg tablet, Take 10 mg by mouth daily., Disp: , Rfl:   ???  diclofenac EC (VOLTAREN) 75 mg EC tablet, Take 1 Tab by mouth two (2) times a day. Take with food, Disp: 30 Tab, Rfl: 0  ???  divalproex ER (DEPAKOTE ER) 250 mg ER tablet, 1,000 mg nightly., Disp: , Rfl: 4  ???  DAILY-VITE tablet, TAKE 1 TABLET BY MOUTH DAILY, Disp: , Rfl: 11    No Known Allergies    Physical Exam:  General: well appearing male in no acute distress    Musculoskeletal:   Physical exam left knee:  No effusion.  Patient has full extension, flexion 130??.  Tenderness palpation at the medial joint line.  Discomfort patellofemoral grind testing.  Stable exam of the ACL, PCL, LCL, MCL.  Soft calf and compartments for capillary refill less than 3 sec.  No pain with hip rotation. Calf nontender.    Radiographs: Review of the MRI left knee [facility] 06/25/2018:  Medial meniscus tear, chondromalacia medial femoral condyle, small effusion, small Baker cyst    Assessment/Plan: George Norman is a 52 y.o. male with 2 month history of worsening anterior medial left knee pain.  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  X-rays negative  for osteoarthritis or joint space loss.  I have discussed with patient treatment options including living with symptoms, cortisone injection or arthroscopic surgery for meniscectomy.  He would like to give this more time to see if it will improve.  Follow up in 1 month for re-evaluation.  If he would like a cortisone shot any time he can call our office and we can arrange this for him.  If he does not improve with further time were non operative treatment, he may benefit from arthroscopic meniscectomy.  1.  Activity modification, avoid painful in aggravating activities.  2.  Anti-inflammatories and/or analgesics as needed.  3.  All questions and concerns have been answered, patient is agreeable with this plan, call with any questions or concerns.

## 2018-07-04 NOTE — Progress Notes (Signed)
HPI: Candelaria CelesteRobert Gorgas is a 52 y.o. male here for follow-up evaluation of left knee MRI.  Patient has had a 2 month history of atraumatic onset left knee pain.  Pain is over the anterior knee region as well as at the medial joint line.  He did have a fall in the winter time but did not have immediate pain after that.  No fevers or chills.  He did present to Urgent Care where he did have x-rays that did not show evidence of osteoarthritis.  Pain level is 7/10.  Denies locking or catching.  Denies effusion. Patient does take diclofenac.  No history of injury.      ROS: Denies fevers, chills, nausea, vomiting. Systems negative except for pertinent positives noted in HPI.    There is no problem list on file for this patient.        Current Outpatient Medications:   ???  loratadine (CLARITIN) 10 mg tablet, Take 10 mg by mouth daily., Disp: , Rfl:   ???  diclofenac EC (VOLTAREN) 75 mg EC tablet, Take 1 Tab by mouth two (2) times a day. Take with food, Disp: 30 Tab, Rfl: 0  ???  divalproex ER (DEPAKOTE ER) 250 mg ER tablet, 1,000 mg nightly., Disp: , Rfl: 4  ???  DAILY-VITE tablet, TAKE 1 TABLET BY MOUTH DAILY, Disp: , Rfl: 11    No Known Allergies    Physical Exam:  General: well appearing male in no acute distress    Musculoskeletal:   Physical exam left knee:  No effusion.  Patient has full extension, flexion 130??.  Tenderness palpation at the medial joint line.  Discomfort patellofemoral grind testing.  Stable exam of the ACL, PCL, LCL, MCL.  Soft calf and compartments for capillary refill less than 3 sec.  No pain with hip rotation. Calf nontender.    Radiographs: Review of the MRI left knee [facility] 06/25/2018:  Medial meniscus tear, chondromalacia medial femoral condyle, small effusion, small Baker cyst    Assessment/Plan: Candelaria CelesteRobert Kolle is a 52 y.o. male with 2 month history of worsening anterior medial left knee pain.  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  X-rays negative  for osteoarthritis or joint space loss.  I have discussed with patient treatment options including living with symptoms, cortisone injection or arthroscopic surgery for meniscectomy.  He would like to give this more time to see if it will improve.  Follow up in 1 month for re-evaluation.  If he would like a cortisone shot any time he can call our office and we can arrange this for him.  If he does not improve with further time were non operative treatment, he may benefit from arthroscopic meniscectomy.  1.  Activity modification, avoid painful in aggravating activities.  2.  Anti-inflammatories and/or analgesics as needed.  3.  All questions and concerns have been answered, patient is agreeable with this plan, call with any questions or concerns.

## 2018-08-08 ENCOUNTER — Encounter: Attending: Orthopaedic Surgery

## 2018-08-22 ENCOUNTER — Encounter: Attending: Orthopaedic Surgery

## 2018-09-02 ENCOUNTER — Ambulatory Visit: Attending: Orthopaedic Surgery | Primary: Family

## 2018-09-02 ENCOUNTER — Ambulatory Visit: Admit: 2018-09-02 | Discharge: 2018-09-02 | Payer: MEDICARE | Attending: Orthopaedic Surgery

## 2018-09-02 DIAGNOSIS — S83242D Other tear of medial meniscus, current injury, left knee, subsequent encounter: Secondary | ICD-10-CM

## 2018-09-02 MED ORDER — TRIAMCINOLONE ACETONIDE 40 MG/ML SUSP FOR INJECTION
40 mg/mL | Freq: Once | INTRAMUSCULAR | Status: AC
Start: 2018-09-02 — End: 2018-09-02
  Administered 2018-09-02: 15:00:00 via INTRA_ARTICULAR

## 2018-09-02 NOTE — Progress Notes (Signed)
HPI: George Norman is a 52 y.o. male here for follow-up of left knee pain. Patient has had MRI which shows evidence of chondromalacia with meniscus tear.  Atraumatic onset of pain. Patient has pain primarily at the medial joint line posterior knee region.  He did initially present to Urgent Care where x-rays are negative for osteoarthritis.  Patient does take diclofenac.  No history of injury.  Pain can be 7/10.  He states that he has been having giving way episodes and falling that occurred this past Friday, 3 days ago.      ROS: Denies fevers, chills, nausea, vomiting. Systems negative except for pertinent positives noted in HPI.    There is no problem list on file for this patient.        Current Outpatient Medications:   ???  loratadine (CLARITIN) 10 mg tablet, Take 10 mg by mouth daily., Disp: , Rfl:   ???  diclofenac EC (VOLTAREN) 75 mg EC tablet, Take 1 Tab by mouth two (2) times a day. Take with food, Disp: 30 Tab, Rfl: 0  ???  divalproex ER (DEPAKOTE ER) 250 mg ER tablet, 1,000 mg nightly., Disp: , Rfl: 4  ???  DAILY-VITE tablet, TAKE 1 TABLET BY MOUTH DAILY, Disp: , Rfl: 11    No Known Allergies    Physical Exam:  General: well appearing male in no acute distress    Musculoskeletal:   Physical exam left knee:  Patient has full extension, no effusion.  Tenderness palpation at the medial joint line.  Pain with medial circumduction maneuvers.  Discomfort patellofemoral grind testing.  Full extension with flexion to 130??.  No pain with hip rotation.  Soft compartments.  Normal gait with ambulation.    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    Procedure note:  Patient received a cortisone injection in left  knee under sterile conditions with 1 cc of Kenalog and 8 cc of Marcaine under sterile conditions.  Informed consent is obtained. Risk of infection and cortisone reaction is discussed with  patient.    Assessment/Plan: George CelesteRobert Krizek is a 52 y.o. 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  Patient received a cortisone injection today.  Plan close follow-up in 1 month for re-evaluation of efficacy.  If he has had still having symptoms next step would be arthroscopic debridement with meniscectomy.  I discussed with patient that arthroscopic surgery would help pain coming from the meniscus tear but would not help any pain coming from the chondromalacia unfortunately.  He understands this.  All questions have been answered.  He is agreeable to this plan.  1.  Activity modification, avoid painful in aggravating activities.  2.  Anti-inflammatories and/or analgesics as needed.  3.  All questions and concerns have been answered, patient is agreeable with this plan, call with any questions or concerns.

## 2018-09-02 NOTE — Progress Notes (Signed)
HPI: George Norman is a 52 y.o. male here for follow-up of left knee pain. Patient has had MRI which shows evidence of chondromalacia with meniscus tear.  Atraumatic onset of pain. Patient has pain primarily at the medial joint line posterior knee region.  He did initially present to Urgent Care where x-rays are negative for osteoarthritis.  Patient does take diclofenac.  No history of injury.  Pain can be 7/10.  He states that he has been having giving way episodes and falling that occurred this past Friday, 3 days ago.      ROS: Denies fevers, chills, nausea, vomiting. Systems negative except for pertinent positives noted in HPI.    There is no problem list on file for this patient.        Current Outpatient Medications:   ???  loratadine (CLARITIN) 10 mg tablet, Take 10 mg by mouth daily., Disp: , Rfl:   ???  diclofenac EC (VOLTAREN) 75 mg EC tablet, Take 1 Tab by mouth two (2) times a day. Take with food, Disp: 30 Tab, Rfl: 0  ???  divalproex ER (DEPAKOTE ER) 250 mg ER tablet, 1,000 mg nightly., Disp: , Rfl: 4  ???  DAILY-VITE tablet, TAKE 1 TABLET BY MOUTH DAILY, Disp: , Rfl: 11    No Known Allergies    Physical Exam:  General: well appearing male in no acute distress    Musculoskeletal:   Physical exam left knee:  Patient has full extension, no effusion.  Tenderness palpation at the medial joint line.  Pain with medial circumduction maneuvers.  Discomfort patellofemoral grind testing.  Full extension with flexion to 130??.  No pain with hip rotation.  Soft compartments.  Normal gait with ambulation.    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    Procedure note:  Patient received a cortisone injection in left  knee under sterile conditions with 1 cc of Kenalog and 8 cc of Marcaine under sterile conditions.  Informed consent is obtained. Risk of infection and  cortisone reaction is discussed with patient.    Assessment/Plan: George Norman is a 52 y.o. 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  Patient received a cortisone injection today.  Plan close follow-up in 1 month for re-evaluation of efficacy.  If he has had still having symptoms next step would be arthroscopic debridement with meniscectomy.  I discussed with patient that arthroscopic surgery would help pain coming from the meniscus tear but would not help any pain coming from the chondromalacia unfortunately.  He understands this.  All questions have been answered.  He is agreeable to this plan.  1.  Activity modification, avoid painful in aggravating activities.  2.  Anti-inflammatories and/or analgesics as needed.  3.  All questions and concerns have been answered, patient is agreeable with this plan, call with any questions or concerns.

## 2018-10-07 ENCOUNTER — Ambulatory Visit: Attending: Adult Health | Primary: Family

## 2018-10-07 ENCOUNTER — Ambulatory Visit: Admit: 2018-10-07 | Discharge: 2018-10-07 | Payer: MEDICARE | Attending: Adult Health

## 2018-10-07 DIAGNOSIS — S83242D Other tear of medial meniscus, current injury, left knee, subsequent encounter: Secondary | ICD-10-CM

## 2018-10-07 MED ORDER — DICLOFENAC 75 MG TAB, DELAYED RELEASE
75 mg | ORAL_TABLET | Freq: Two times a day (BID) | ORAL | 3 refills | Status: AC
Start: 2018-10-07 — End: ?

## 2018-10-07 NOTE — Patient Instructions (Signed)
Knee: Exercises  Introduction  Here are some examples of exercises for you to try. The exercises may be suggested for a condition or for rehabilitation. Start each exercise slowly. Ease off the exercises if you start to have pain.  You will be told when to start these exercises and which ones will work best for you.  How to do the exercises  Quad sets    1. Sit with your leg straight and supported on the floor or a firm bed. (If you feel discomfort in the front or back of your knee, place a small towel roll under your knee.)  2. Tighten the muscles on top of your thigh by pressing the back of your knee flat down to the floor. (If you feel discomfort under your kneecap, place a small towel roll under your knee.)  3. Hold for about 6 seconds, then rest for up to 10 seconds.  4. Do 8 to 12 repetitions several times a day.    Straight-leg raises to the front    1. Lie on your back with your good knee bent so that your foot rests flat on the floor. Your injured leg should be straight. Make sure that your low back has a normal curve. You should be able to slip your flat hand in between the floor and the small of your back, with your palm touching the floor and your back touching the back of your hand.  2. Tighten the thigh muscles in the injured leg by pressing the back of your knee flat down to the floor. Hold your knee straight.  3. Keeping the thigh muscles tight, lift your injured leg up so that your heel is about 12 inches off the floor. Hold for about 6 seconds and then lower slowly.  4. Do 8 to 12 repetitions, 3 times a day.    Straight-leg raises to the outside    1. Lie on your side, with your injured leg on top.  2. Tighten the front thigh muscles of your injured leg to keep your knee straight.  3. Keep your hip and your leg straight in line with the rest of your body, and keep your knee pointing forward. Do not drop your hip back.  4. Lift your injured leg straight up toward the ceiling, about 12 inches  off the floor. Hold for about 6 seconds, then slowly lower your leg.  5. Do 8 to 12 repetitions.    Straight-leg raises to the back    1. Lie on your stomach, and lift your leg straight up behind you (toward the ceiling).  2. Lift your toes about 6 inches off the floor, hold for about 6 seconds, then lower slowly.  3. Do 8 to 12 repetitions.    Straight-leg raises to the inside    1. Lie on the side of your body with the injured leg.  2. You can either prop your other (good) leg up on a chair, or you can bend your good knee and put that foot in front of your injured knee. Do not drop your hip back.  3. Tighten the muscles on the front of your thigh to straighten your injured knee.  4. Keep your kneecap pointing forward, and lift your whole leg up toward the ceiling about 6 inches. Hold for about 6 seconds, then lower slowly.  5. Do 8 to 12 repetitions.    Heel dig bridging    1. Lie on your back with both knees bent and   your ankles bent so that only your heels are digging into the floor. Your knees should be bent about 90 degrees.  2. Then push your heels into the floor, squeeze your buttocks, and lift your hips off the floor until your shoulders, hips, and knees are all in a straight line.  3. Hold for about 6 seconds as you continue to breathe normally, and then slowly lower your hips back down to the floor and rest for up to 10 seconds.  4. Do 8 to 12 repetitions.    Hamstring curls    1. Lie on your stomach with your knees straight. If your kneecap is uncomfortable, roll up a washcloth and put it under your leg just above your kneecap.  2. Lift the foot of your injured leg by bending the knee so that you bring the foot up toward your buttock. If this motion hurts, try it without bending your knee quite as far. This may help you avoid any painful motion.  3. Slowly lower your leg back to the floor.  4. Do 8 to 12 repetitions.  5. With permission from your doctor or physical therapist, you may also  want to add a cuff weight to your ankle (not more than 5 pounds). With weight, you do not have to lift your leg more than 12 inches to get a hamstring workout.    Shallow standing knee bends    1. Stand with your hands lightly resting on a counter or chair in front of you. Put your feet shoulder-width apart.  2. Slowly bend your knees so that you squat down like you are going to sit in a chair. Make sure your knees do not go in front of your toes.  3. Lower yourself about 6 inches. Your heels should remain on the floor at all times.  4. Rise slowly to a standing position.    Heel raises    1. Stand with your feet a few inches apart, with your hands lightly resting on a counter or chair in front of you.  2. Slowly raise your heels off the floor while keeping your knees straight.  3. Hold for about 6 seconds, then slowly lower your heels to the floor.  4. Do 8 to 12 repetitions several times during the day.    Follow-up care is a key part of your treatment and safety. Be sure to make and go to all appointments, and call your doctor if you are having problems. It's also a good idea to know your test results and keep a list of the medicines you take.  Where can you learn more?  Go to http://www.healthwise.net/[facility].  Enter Q615 in the search box to learn more about "Knee: Exercises."  Current as of: May 15, 2018  Content Version: 12.2  ?? 2006-2019 [employer]. Care instructions adapted under license by [employer] [facility] (which disclaims liability or warranty for this information). If you have questions about a medical condition or this instruction, always ask your healthcare professional. [employer] disclaims any warranty or liability for your use of this information.

## 2018-10-07 NOTE — Progress Notes (Signed)
History of Present Illness: George Norman is a 52 y.o. male here for follow-up of his right knee.  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 He was given a cortisone injection 09/02/2018 with only about an hour of relief.  The cortisone itself never really worked.  He reports pain with range of motion and with walking up to 7/10.  He is taking Tylenol as needed.  He is helping a friend out with fencing.  He is considering surgery, but would like to wait till after the new year.  He does feel popping sensation, along with feelings of giving way.  He states he actually fell 2 or 3 times recently.      Review Of Systems: Denies fevers, chills, nausea, vomiting. Systems negative except for pertinent positives noted in HPI.    There is no problem list on file for this patient.        Current Outpatient Medications:   ???  diclofenac EC (VOLTAREN) 75 mg EC tablet, Take 1 Tab by mouth two (2) times a day. Take with food, Disp: 60 Tab, Rfl: 3  ???  loratadine (CLARITIN) 10 mg tablet, Take 10 mg by mouth daily., Disp: , Rfl:   ???  divalproex ER (DEPAKOTE ER) 250 mg ER tablet, 1,000 mg nightly., Disp: , Rfl: 4  ???  DAILY-VITE tablet, TAKE 1 TABLET BY MOUTH DAILY, Disp: , Rfl: 11    No Known Allergies    Physical Exam:  General: well appearing Male  in no acute distress  Musculoskeletal: Examination of the left knee reveals intact skin, no effusion. Painful full extension, flexion to 120 also with discomfort. +medial joint line tenderness, pain with patellofemoral grind. Stable to varus and valgus stress, stable anterior drawer. +McMurrays on the medial side. Calf soft/nontender.         Assessment/Plan: George Norman is a 52 y.o. male with right knee medial meniscal tear, chondromalacia.  1.   He did not have any relief with the cortisone.  He states he has taken diclofenac in the past with good relief.  We will reorder this to take once or twice daily as needed with food.   2.   Will try a hinged knee brace for activities for stability.  3.   Home exercise program.  Advised if he would like to try formal physical therapy, he may call me at any time and I will order this.  4. Follow up in 2 months for re-evaluation. Certainly call with questions or concerns. He is in agreement with this plan.

## 2018-10-07 NOTE — Progress Notes (Signed)
History of Present Illness: George Norman is a 52 y.o. male here for follow-up of his right knee.  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 He was given a cortisone injection 09/02/2018 with only about an hour of relief.  The cortisone itself never really worked.  He reports pain with range of motion and with walking up to 7/10.  He is taking Tylenol as needed.  He is helping a friend out with fencing.  He is considering surgery, but would like to wait till after the new year.  He does feel popping sensation, along with feelings of giving way.  He states he actually fell 2 or 3 times recently.      Review Of Systems: Denies fevers, chills, nausea, vomiting. Systems negative except for pertinent positives noted in HPI.    There is no problem list on file for this patient.        Current Outpatient Medications:   ???  diclofenac EC (VOLTAREN) 75 mg EC tablet, Take 1 Tab by mouth two (2) times a day. Take with food, Disp: 60 Tab, Rfl: 3  ???  loratadine (CLARITIN) 10 mg tablet, Take 10 mg by mouth daily., Disp: , Rfl:   ???  divalproex ER (DEPAKOTE ER) 250 mg ER tablet, 1,000 mg nightly., Disp: , Rfl: 4  ???  DAILY-VITE tablet, TAKE 1 TABLET BY MOUTH DAILY, Disp: , Rfl: 11    No Known Allergies    Physical Exam:  General: well appearing Male  in no acute distress  Musculoskeletal: Examination of the left knee reveals intact skin, no effusion. Painful full extension, flexion to 120 also with discomfort. +medial joint line tenderness, pain with patellofemoral grind. Stable to varus and valgus stress, stable anterior drawer. +McMurrays on the medial side. Calf soft/nontender.         Assessment/Plan: George Norman is a 52 y.o. male with right knee medial meniscal tear, chondromalacia.  1.   He did not have any relief with the cortisone.  He states he has taken diclofenac in the past with good relief.  We will reorder this to take once or twice daily as needed with food.  2.   Will try a  hinged knee brace for activities for stability.  3.   Home exercise program.  Advised if he would like to try formal physical therapy, he may call me at any time and I will order this.  4. Follow up in 2 months for re-evaluation. Certainly call with questions or concerns. He is in agreement with this plan.

## 2018-12-09 ENCOUNTER — Ambulatory Visit: Attending: Orthopaedic Surgery | Primary: Family

## 2018-12-09 ENCOUNTER — Ambulatory Visit: Admit: 2018-12-09 | Discharge: 2018-12-09 | Payer: MEDICARE | Attending: Orthopaedic Surgery

## 2018-12-09 DIAGNOSIS — S83242D Other tear of medial meniscus, current injury, left knee, subsequent encounter: Secondary | ICD-10-CM

## 2018-12-09 NOTE — Progress Notes (Signed)
HPI: George Norman is a 53 y.o. male here for follow-up of persistent left medial-sided knee pain.  He has been worked up with an MRI which shows medial meniscus tear as well as chondromalacia.  He did not have lasting relief with cortisone injection on 09/02/2018.  Patient states that he does have high-level pain which elevates to 10/10.  He states that he does feel popping in his knee.  He states that he almost 11 stairs with his granddaughter.  He is using a knee brace.  She states that he has fallen several times.  No other complaints of pain elsewhere.  Denies numbness or tingling.  Patient works as a Public affairs consultant.      ROS: Denies fevers, chills, nausea, vomiting. Systems negative except for pertinent positives noted in HPI.    There is no problem list on file for this patient.        Current Outpatient Medications:   ???  diclofenac EC (VOLTAREN) 75 mg EC tablet, Take 1 Tab by mouth two (2) times a day. Take with food, Disp: 60 Tab, Rfl: 3  ???  loratadine (CLARITIN) 10 mg tablet, Take 10 mg by mouth daily., Disp: , Rfl:   ???  divalproex ER (DEPAKOTE ER) 250 mg ER tablet, 1,000 mg nightly., Disp: , Rfl: 4  ???  DAILY-VITE tablet, TAKE 1 TABLET BY MOUTH DAILY, Disp: , Rfl: 11    No Known Allergies    Physical Exam:  General: well appearing male in no acute distress    Musculoskeletal:   Physical exam left knee:  No effusion.  Patient has full extension, flexion 125??.  Tenderness palpation at the medial joint line.  Stable exam of the ACL, PCL, LCL, MCL.  Pain with medial circumduction maneuvers and McMurray's.  Pain with patellofemoral grind testing.  Minimal tenderness to palpation at the lateral knee.  Capillary refill less than 3 seconds.  No pain with hip rotation.    Radiographs: Review of the MRI left knee [facility] 06/25/2018 show evidence of tear of the posterior horn medial meniscus, suggestion of chondromalacia medial femoral condyle, small joint effusion and small Baker cyst.  X-ray left knee [facility]  06/11/2018:  No acute findings are seen.  No evidence for significant osteoarthritis.    Assessment/Plan: [name] is a 53 y.o. male with high-level pain in his right knee that seems out of proportion to his MRI and clinical findings.  He has not improved with time and cortisone injection.  He continues to have giving way episodes where he has fallen several times and had nearly fell downstairs caring his granddaughter.  X-rays do not show osteoarthritis.  I've discussed with him treatment options including living with symptoms verses arthroscopic meniscectomy with debridement.  I've discussed with patient that arthroscopic surgery would help pain coming from the meniscus tear but would not help pain coming from a osteoarthritis or chondromalacia in his knee he understands this.  We discussed recovery which is outpatient surgery, immediate weight-bearing, expected swelling and soreness for the 1st 6- 8 weeks during the healing phase.  Crutches for 2 weeks as needed.  Physical therapy for range of motion strengthening.  Keeping incision clean and dry until sutures are removed at the 1st postoperative visit.  Full recovery at 3 months.  Risk of surgery include bleeding, deep infection requiring further surgery, incomplete relief of symptoms, need for further procedures, blood clots, anesthesia risk including stroke, heart attack, respiratory failure, incomplete relief of symptoms.  Informed consent  has been obtained.  All questions have been answered preoperative medical evaluation  1.  Activity modification, avoid painful in aggravating activities.  2.  Anti-inflammatories and/or analgesics as needed.  3.  All questions and concerns have been answered, patient is agreeable with this plan, call with any questions or concerns.

## 2018-12-09 NOTE — Progress Notes (Signed)
HPI: George Norman is a 53 y.o. male here for follow-up of persistent left medial-sided knee pain.  He has been worked up with an MRI which shows medial meniscus tear as well as chondromalacia.  He did not have lasting relief with cortisone injection on 09/02/2018.  Patient states that he does have high-level pain which elevates to 10/10.  He states that he does feel popping in his knee.  He states that he almost 11 stairs with his granddaughter.  He is using a knee brace.  She states that he has fallen several times.  No other complaints of pain elsewhere.  Denies numbness or tingling.  Patient works as a Public affairs consultant.      ROS: Denies fevers, chills, nausea, vomiting. Systems negative except for pertinent positives noted in HPI.    There is no problem list on file for this patient.        Current Outpatient Medications:   ???  diclofenac EC (VOLTAREN) 75 mg EC tablet, Take 1 Tab by mouth two (2) times a day. Take with food, Disp: 60 Tab, Rfl: 3  ???  loratadine (CLARITIN) 10 mg tablet, Take 10 mg by mouth daily., Disp: , Rfl:   ???  divalproex ER (DEPAKOTE ER) 250 mg ER tablet, 1,000 mg nightly., Disp: , Rfl: 4  ???  DAILY-VITE tablet, TAKE 1 TABLET BY MOUTH DAILY, Disp: , Rfl: 11    No Known Allergies    Physical Exam:  General: well appearing male in no acute distress    Musculoskeletal:   Physical exam left knee:  No effusion.  Patient has full extension, flexion 125??.  Tenderness palpation at the medial joint line.  Stable exam of the ACL, PCL, LCL, MCL.  Pain with medial circumduction maneuvers and McMurray's.  Pain with patellofemoral grind testing.  Minimal tenderness to palpation at the lateral knee.  Capillary refill less than 3 seconds.  No pain with hip rotation.    Radiographs: Review of the MRI left knee [facility] 06/25/2018 show evidence of tear of the posterior horn medial meniscus, suggestion of chondromalacia medial femoral condyle, small joint effusion and small  Baker cyst.  X-ray left knee [facility] 06/11/2018:  No acute findings are seen.  No evidence for significant osteoarthritis.    Assessment/Plan: George Norman is a 53 y.o. male with high-level pain in his right knee that seems out of proportion to his MRI and clinical findings.  He has not improved with time and cortisone injection.  He continues to have giving way episodes where he has fallen several times and had nearly fell downstairs caring his granddaughter.  X-rays do not show osteoarthritis.  I've discussed with him treatment options including living with symptoms verses arthroscopic meniscectomy with debridement.  I've discussed with patient that arthroscopic surgery would help pain coming from the meniscus tear but would not help pain coming from a osteoarthritis or chondromalacia in his knee he understands this.  We discussed recovery which is outpatient surgery, immediate weight-bearing, expected swelling and soreness for the 1st 6- 8 weeks during the healing phase.  Crutches for 2 weeks as needed.  Physical therapy for range of motion strengthening.  Keeping incision clean and dry until sutures are removed at the 1st postoperative visit.  Full recovery at 3 months.  Risk of surgery include bleeding, deep infection requiring further surgery, incomplete relief of symptoms, need for further procedures, blood clots, anesthesia risk including stroke, heart attack, respiratory failure, incomplete relief of symptoms.  Informed consent  has been obtained.  All questions have been answered preoperative medical evaluation  1.  Activity modification, avoid painful in aggravating activities.  2.  Anti-inflammatories and/or analgesics as needed.  3.  All questions and concerns have been answered, patient is agreeable with this plan, call with any questions or concerns.

## 2018-12-10 ENCOUNTER — Inpatient Hospital Stay: Admit: 2018-12-10 | Payer: MEDICARE

## 2018-12-10 ENCOUNTER — Encounter

## 2018-12-10 DIAGNOSIS — Z01818 Encounter for other preprocedural examination: Secondary | ICD-10-CM

## 2018-12-10 LAB — BASIC METABOLIC PANEL
Anion Gap: 3 mmol/L — ABNORMAL LOW (ref 5–15)
BUN: 18 MG/DL (ref 7–18)
Bun/Cre Ratio: 21 NA — ABNORMAL HIGH (ref 12–20)
CO2: 30 mmol/L (ref 21–32)
Calcium: 9.2 MG/DL (ref 8.5–10.1)
Chloride: 105 mmol/L (ref 98–110)
Creatinine: 0.85 MG/DL (ref 0.70–1.30)
EGFR IF NonAfrican American: >60 mL/min/{1.73_m2} (ref >60)
GFR African American: >60 mL/min/{1.73_m2} (ref >60)
Glucose: 100 mg/dL (ref 70–100)
Potassium: 4.2 mmol/L (ref 3.5–5.1)
Sodium: 138 mmol/L (ref 136–145)

## 2018-12-10 LAB — CBC WITH AUTO DIFFERENTIAL
Basophils %: 1 % (ref 0–1.2)
Basophils Absolute: 0 10*3/uL (ref 0.0–0.1)
Eosinophils %: 2 %
Eosinophils Absolute: 0.1 10*3/uL (ref 0.0–0.5)
Granulocyte Absolute Count: 0 10*3/uL (ref 0.00–0.03)
Hematocrit: 43 % (ref 40.1–51.0)
Hemoglobin: 13.9 g/dL (ref 13.7–17.5)
Immature Granulocytes: 1 % — ABNORMAL HIGH (ref 0.0–0.4)
Lymphocytes %: 37 %
Lymphocytes Absolute: 2.3 10*3/uL (ref 1.2–3.7)
MCH: 30.1 PG (ref 25.6–32.2)
MCHC: 32.3 g/dL (ref 32.2–35.5)
MCV: 93.1 FL (ref 80.0–95.0)
MPV: 10.9 FL (ref 9.4–12.4)
Monocytes %: 9 %
Monocytes Absolute: 0.6 10*3/uL (ref 0.2–0.8)
Neutrophils %: 50 %
Neutrophils Absolute: 3.3 10*3/uL (ref 1.6–6.1)
Platelets: 203 10*3/uL (ref 150–400)
RBC: 4.62 M/uL (ref 4.51–5.93)
RDW: 12.4 % (ref 11.6–14.4)
WBC: 6.4 10*3/uL (ref 4.0–10.0)

## 2018-12-10 LAB — METABOLIC PANEL, BASIC
Anion gap: 3 mmol/L — ABNORMAL LOW (ref 5–15)
BUN/Creatinine ratio: 21 — ABNORMAL HIGH (ref 12–20)
BUN: 18 MG/DL (ref 7–18)
CO2: 30 mmol/L (ref 21–32)
Calcium: 9.2 MG/DL (ref 8.5–10.1)
Chloride: 105 mmol/L (ref 98–110)
Creatinine: 0.85 MG/DL (ref 0.70–1.30)
GFR est AA: >60 mL/min/{1.73_m2} (ref >60)
GFR est non-AA: >60 mL/min/{1.73_m2} (ref >60)
Glucose: 100 mg/dL (ref 70–100)
Potassium: 4.2 mmol/L (ref 3.5–5.1)
Sodium: 138 mmol/L (ref 136–145)

## 2018-12-10 LAB — CBC WITH AUTOMATED DIFF
ABS. BASOPHILS: 0 10*3/uL (ref 0.0–0.1)
ABS. EOSINOPHILS: 0.1 10*3/uL (ref 0.0–0.5)
ABS. IMM. GRANS.: 0 10*3/uL (ref 0.00–0.03)
ABS. LYMPHOCYTES: 2.3 10*3/uL (ref 1.2–3.7)
ABS. MONOCYTES: 0.6 10*3/uL (ref 0.2–0.8)
ABS. NEUTROPHILS: 3.3 10*3/uL (ref 1.6–6.1)
BASOPHILS: 1 % (ref 0–1.2)
EOSINOPHILS: 2 %
HCT: 43 % (ref 40.1–51.0)
HGB: 13.9 g/dL (ref 13.7–17.5)
IMMATURE GRANULOCYTES: 1 % — ABNORMAL HIGH (ref 0.0–0.4)
LYMPHOCYTES: 37 %
MCH: 30.1 PG (ref 25.6–32.2)
MCHC: 32.3 g/dL (ref 32.2–35.5)
MCV: 93.1 FL (ref 80.0–95.0)
MONOCYTES: 9 %
MPV: 10.9 FL (ref 9.4–12.4)
NEUTROPHILS: 50 %
PLATELET: 203 10*3/uL (ref 150–400)
RBC: 4.62 M/uL (ref 4.51–5.93)
RDW: 12.4 % (ref 11.6–14.4)
WBC: 6.4 10*3/uL (ref 4.0–10.0)

## 2018-12-10 NOTE — Progress Notes (Signed)
Patient is scheduled for a lt knee scope partial medial meniscectomy, chondroplasty surgery with Dr Elijah Birk    Date of Surgery: 12/23/18  Postop appointment date and time: 12/31/18 at 1:45

## 2018-12-10 NOTE — Progress Notes (Signed)
Patient is scheduled for a lt knee scope partial medial meniscectomy, chondroplasty surgery with Dr Tom    Date of Surgery: 12/23/18  Postop appointment date and time: 12/31/18 at 1:45

## 2018-12-19 NOTE — Progress Notes (Signed)
 Opioid prescription monitoring   [place] PMDP checked: today  Patient has not filled any opioids in the last year in: [place], [place], [place] & [place]

## 2018-12-19 NOTE — Progress Notes (Signed)
Opioid prescription monitoring   [place] PMDP checked: today  Patient has not filled any opioids in the last year in: [place], [place], [place] & [place]

## 2018-12-23 ENCOUNTER — Encounter

## 2018-12-23 MED ORDER — MELOXICAM 15 MG TAB
15 mg | ORAL_TABLET | Freq: Every day | ORAL | 1 refills | Status: AC
Start: 2018-12-23 — End: ?

## 2018-12-23 MED ORDER — OXYCODONE-ACETAMINOPHEN 5 MG-325 MG TAB
5-325 mg | ORAL_TABLET | Freq: Four times a day (QID) | ORAL | 0 refills | Status: AC | PRN
Start: 2018-12-23 — End: 2018-12-30

## 2018-12-23 MED ORDER — ASPIRIN 325 MG TAB
325 mg | ORAL_TABLET | Freq: Every day | ORAL | 0 refills | Status: AC
Start: 2018-12-23 — End: 2019-01-22

## 2018-12-23 NOTE — Progress Notes (Signed)
OR rescheduled to 12/27/18

## 2018-12-23 NOTE — Progress Notes (Signed)
OR rescheduled to 12/27/18

## 2018-12-27 ENCOUNTER — Inpatient Hospital Stay: Payer: MEDICARE

## 2018-12-27 MED ORDER — MIDAZOLAM 1 MG/ML IJ SOLN
1 mg/mL | INTRAMUSCULAR | Status: AC
Start: 2018-12-27 — End: ?

## 2018-12-27 MED ORDER — PROMETHAZINE 25 MG/ML INJECTION
25 mg/mL | INTRAMUSCULAR | Status: DC | PRN
Start: 2018-12-27 — End: 2018-12-27

## 2018-12-27 MED ORDER — KETOROLAC TROMETHAMINE 30 MG/ML INJECTION
30 mg/mL (1 mL) | INTRAMUSCULAR | Status: AC
Start: 2018-12-27 — End: ?

## 2018-12-27 MED ORDER — LACTATED RINGERS IV
INTRAVENOUS | Status: DC
Start: 2018-12-27 — End: 2018-12-27
  Administered 2018-12-27: 15:00:00 via INTRAVENOUS

## 2018-12-27 MED ORDER — LIDOCAINE (PF) 20 MG/ML (2 %) IJ SOLN
20 mg/mL (2 %) | INTRAMUSCULAR | Status: AC
Start: 2018-12-27 — End: ?

## 2018-12-27 MED ORDER — PROPOFOL 10 MG/ML IV EMUL
10 mg/mL | INTRAVENOUS | Status: AC
Start: 2018-12-27 — End: ?

## 2018-12-27 MED ORDER — MIDAZOLAM 1 MG/ML IJ SOLN
1 mg/mL | INTRAMUSCULAR | Status: DC | PRN
Start: 2018-12-27 — End: 2018-12-27
  Administered 2018-12-27: 17:00:00 via INTRAVENOUS

## 2018-12-27 MED ORDER — FENTANYL CITRATE (PF) 50 MCG/ML IJ SOLN
50 mcg/mL | INTRAMUSCULAR | Status: DC | PRN
Start: 2018-12-27 — End: 2018-12-27

## 2018-12-27 MED ORDER — KETOROLAC TROMETHAMINE 30 MG/ML INJECTION
30 mg/mL (1 mL) | INTRAMUSCULAR | Status: DC | PRN
Start: 2018-12-27 — End: 2018-12-27
  Administered 2018-12-27: 18:00:00 via INTRAVENOUS

## 2018-12-27 MED ORDER — SEVOFLURANE FOR INHALATION
RESPIRATORY_TRACT | Status: AC
Start: 2018-12-27 — End: ?

## 2018-12-27 MED ORDER — MEPERIDINE (PF) 25 MG/ML INJ SOLUTION
25 mg/ml | INTRAMUSCULAR | Status: DC | PRN
Start: 2018-12-27 — End: 2018-12-27

## 2018-12-27 MED ORDER — FENTANYL CITRATE (PF) 50 MCG/ML IJ SOLN
50 mcg/mL | INTRAMUSCULAR | Status: AC
Start: 2018-12-27 — End: ?

## 2018-12-27 MED ORDER — DEXAMETHASONE SODIUM PHOSPHATE 4 MG/ML IJ SOLN
4 mg/mL | INTRAMUSCULAR | Status: AC
Start: 2018-12-27 — End: ?

## 2018-12-27 MED ORDER — OXYCODONE-ACETAMINOPHEN 5 MG-325 MG TAB
5-325 mg | ORAL | Status: DC | PRN
Start: 2018-12-27 — End: 2018-12-27
  Administered 2018-12-27: 19:00:00 via ORAL

## 2018-12-27 MED ORDER — METOCLOPRAMIDE 5 MG/ML IJ SOLN
5 mg/mL | Freq: Once | INTRAMUSCULAR | Status: DC | PRN
Start: 2018-12-27 — End: 2018-12-27

## 2018-12-27 MED ORDER — HYDROMORPHONE 0.5 MG/0.5 ML SYRINGE
0.5 mg/ mL | INTRAMUSCULAR | Status: DC | PRN
Start: 2018-12-27 — End: 2018-12-27

## 2018-12-27 MED ORDER — PROPOFOL 10 MG/ML IV EMUL
10 mg/mL | INTRAVENOUS | Status: DC | PRN
Start: 2018-12-27 — End: 2018-12-27
  Administered 2018-12-27: 18:00:00 via INTRAVENOUS

## 2018-12-27 MED ORDER — FENTANYL CITRATE (PF) 50 MCG/ML IJ SOLN
50 mcg/mL | INTRAMUSCULAR | Status: DC | PRN
Start: 2018-12-27 — End: 2018-12-27
  Administered 2018-12-27 (×2): via INTRAVENOUS

## 2018-12-27 MED ORDER — LIDOCAINE HCL 2 % (20 MG/ML) IJ SOLN
20 mg/mL (2 %) | INTRAMUSCULAR | Status: DC | PRN
Start: 2018-12-27 — End: 2018-12-27
  Administered 2018-12-27: 18:00:00 via INTRAVENOUS

## 2018-12-27 MED ORDER — ONDANSETRON (PF) 4 MG/2 ML INJECTION
4 mg/2 mL | INTRAMUSCULAR | Status: AC
Start: 2018-12-27 — End: ?

## 2018-12-27 MED ORDER — CEFAZOLIN 2 GRAM/100 ML IN DEXTROSE (ISO-OSMOTIC) IVPB
2 gram/100 mL | Freq: Once | INTRAVENOUS | Status: AC
Start: 2018-12-27 — End: 2018-12-27
  Administered 2018-12-27: 18:00:00 via INTRAVENOUS

## 2018-12-27 MED ORDER — BUPIVACAINE-EPINEPHRINE (PF) 0.25 %-1:200,000 IJ SOLN
0.25 %-1:200,000 | INTRAMUSCULAR | Status: DC | PRN
Start: 2018-12-27 — End: 2018-12-27
  Administered 2018-12-27: 18:00:00 via SUBCUTANEOUS

## 2018-12-27 MED FILL — LIDOCAINE (PF) 20 MG/ML (2 %) IJ SOLN: 20 mg/mL (2 %) | INTRAMUSCULAR | Qty: 5

## 2018-12-27 MED FILL — MIDAZOLAM 1 MG/ML IJ SOLN: 1 mg/mL | INTRAMUSCULAR | Qty: 2

## 2018-12-27 MED FILL — LACTATED RINGERS IV: INTRAVENOUS | Qty: 1000

## 2018-12-27 MED FILL — DEXAMETHASONE SODIUM PHOSPHATE 4 MG/ML IJ SOLN: 4 mg/mL | INTRAMUSCULAR | Qty: 2

## 2018-12-27 MED FILL — DIPRIVAN 10 MG/ML INTRAVENOUS EMULSION: 10 mg/mL | INTRAVENOUS | Qty: 20

## 2018-12-27 MED FILL — CEFAZOLIN 2 GRAM/100 ML IN DEXTROSE (ISO-OSMOTIC) IVPB: 2 gram/100 mL | INTRAVENOUS | Qty: 100

## 2018-12-27 MED FILL — ONDANSETRON (PF) 4 MG/2 ML INJECTION: 4 mg/2 mL | INTRAMUSCULAR | Qty: 2

## 2018-12-27 MED FILL — OXYCODONE-ACETAMINOPHEN 5 MG-325 MG TAB: 5-325 mg | ORAL | Qty: 1

## 2018-12-27 MED FILL — FENTANYL CITRATE (PF) 50 MCG/ML IJ SOLN: 50 mcg/mL | INTRAMUSCULAR | Qty: 2

## 2018-12-27 MED FILL — SEVOFLURANE FOR INHALATION: RESPIRATORY_TRACT | Qty: 250

## 2018-12-27 MED FILL — KETOROLAC TROMETHAMINE 30 MG/ML INJECTION: 30 mg/mL (1 mL) | INTRAMUSCULAR | Qty: 1

## 2018-12-27 NOTE — Op Note (Signed)
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 male with left knee pain .  He has been refractory to maximal non operative treatment with time, activity modification, anti-inflammatories.  MRI is significant for left knee medial meniscus tear with chondromalacia medial femoral condyle. We have discussed the recovery which is outpatient surgery, arthroscopic minimally invasive procedure, immediate weight-bearing, expected swelling for 4-6 weeks.  Crutches for 2 weeks.  Full recovery at 3 months.  Risk of surgery includes bleeding, deep infection requiring further surgery, damage to nerves and arteries, pain, scar, stiffness, incomplete relief of symptoms, need for further procedures, infection requiring secondary procedures for debridement, anesthesia risk including stroke, heart attack, respiratory failure.    Operative note:  Patient is seen and evaluated in the pre anesthesia holding area.  The left knee is marked as the operative site.  Patient is then taken to the operating room in a hospital stretcher and gently transferred to the operative table in the supine position. Patient underwent intubation by the anesthesia team.  Examination under anesthesia shows stable exam of the ACL, PCL, LCL, mcl.  Full range of motion symmetric to the opposite side. Tourniquet is placed on the left upper thigh and limb is stabilized in the Acufex leg holder.  The bottom of the  table was dropped.  The contralateral limb was placed in a well-padded leg holder.  Pre scrub was then performed sterile prep and drape was performed.  Esmarch is used to exsanguinate the limb and then tourniquet is inflated to 300 mm of mercury.  A standard lateral portal was then the scope was introduced in semi flexion.  Inspection of the patellofemoral joint shows normal chondral surface the patella, grade 2 chondromalacia of the trochlea.     Medial compartment inspection shows areas of grade 3 chondromalacia of the central half of the medial femoral condyle with loose chondral fragments, tear of the medial meniscus.  An arthroscopic partial medial meniscectomy is performed with a combination of basket forceps and motorized shaver to stable margins.  Abrasion chondroplasty is performed of the medial femoral condyle to stable margins. Lateral compartment inspection shows a normal lateral meniscus, normal chondral surfaces.     Debris is removed from the knee.   The scope was removed.  Portals were closed with  4 -0 nylon.  Dressed with 4 x 4 Webril Ace bandage.  The patient is awakened from anesthesia and brought to the postanesthesia care unit in excellent condition. All the counts were correct. No complications.

## 2018-12-27 NOTE — Interval H&P Note (Signed)
H&P Update:  George Norman was seen and examined.  History and physical has been reviewed. The patient has been examined. There have been no significant clinical changes since the completion of the originally dated History and Physical.

## 2018-12-27 NOTE — Anesthesia Pre-Procedure Evaluation (Signed)
Relevant Problems   No relevant active problems       Anesthetic History   No history of anesthetic complications            Review of Systems / Medical History  Patient summary reviewed, nursing notes reviewed and pertinent labs reviewed    Pulmonary  Within defined limits                 Neuro/Psych     seizures: well controlled         Cardiovascular  Within defined limits                     GI/Hepatic/Renal                Endo/Other  Within defined limits           Other Findings              Physical Exam    Airway  Mallampati: II  TM Distance: 4 - 6 cm  Neck ROM: normal range of motion        Cardiovascular    Rhythm: regular  Rate: normal         Dental  No notable dental hx       Pulmonary  Breath sounds clear to auscultation               Abdominal  GI exam deferred       Other Findings            Anesthetic Plan    ASA: 2  Anesthesia type: general          Induction: Intravenous  Anesthetic plan and risks discussed with: Patient and Spouse

## 2018-12-27 NOTE — Anesthesia Pre-Procedure Evaluation (Signed)
Relevant Problems   No relevant active problems       Anesthetic History   No history of anesthetic complications            Review of Systems / Medical History  Patient summary reviewed, nursing notes reviewed and pertinent labs reviewed    Pulmonary  Within defined limits                 Neuro/Psych     seizures: well controlled         Cardiovascular  Within defined limits                     GI/Hepatic/Renal                Endo/Other  Within defined limits           Other Findings              Physical Exam    Airway  Mallampati: II  TM Distance: 4 - 6 cm  Neck ROM: normal range of motion        Cardiovascular    Rhythm: regular  Rate: normal         Dental  No notable dental hx       Pulmonary  Breath sounds clear to auscultation               Abdominal  GI exam deferred       Other Findings            Anesthetic Plan    ASA: 2  Anesthesia type: general          Induction: Intravenous  Anesthetic plan and risks discussed with: Patient and Spouse

## 2018-12-27 NOTE — Op Note (Signed)
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 male with left knee pain .  He has been refractory to maximal non operative treatment with time, activity modification, anti-inflammatories.  MRI is significant for left knee medial meniscus tear with chondromalacia medial femoral condyle. We have discussed the recovery which is outpatient surgery, arthroscopic minimally invasive procedure, immediate weight-bearing, expected swelling for 4-6 weeks.  Crutches for 2 weeks.  Full recovery at 3 months.  Risk of surgery includes bleeding, deep infection requiring further surgery, damage to nerves and arteries, pain, scar, stiffness, incomplete relief of symptoms, need for further procedures, infection requiring secondary procedures for debridement, anesthesia risk including stroke, heart attack, respiratory failure.    Operative note:  Patient is seen and evaluated in the pre anesthesia holding area.  The left knee is marked as the operative site.  Patient is then taken to the operating room in a hospital stretcher and gently transferred to the operative table in the supine position. Patient underwent intubation by the anesthesia team.  Examination under anesthesia shows stable exam of the ACL, PCL, LCL, mcl.  Full range of motion symmetric to the opposite side. Tourniquet is placed on the left upper thigh and limb is stabilized in the Acufex leg holder.  The bottom of the table was dropped.  The  contralateral limb was placed in a well-padded leg holder.  Pre scrub was then performed sterile prep and drape was performed.  Esmarch is used to exsanguinate the limb and then tourniquet is inflated to 300 mm of mercury.  A standard lateral portal was then the scope was introduced in semi flexion.  Inspection of the patellofemoral joint shows normal chondral surface the patella, grade 2 chondromalacia of the trochlea.     Medial compartment inspection shows areas of grade 3 chondromalacia of the central half of the medial femoral condyle with loose chondral fragments, tear of the medial meniscus.  An arthroscopic partial medial meniscectomy is performed with a combination of basket forceps and motorized shaver to stable margins.  Abrasion chondroplasty is performed of the medial femoral condyle to stable margins. Lateral compartment inspection shows a normal lateral meniscus, normal chondral surfaces.     Debris is removed from the knee.   The scope was removed.  Portals were closed with  4 -0 nylon.  Dressed with 4 x 4 Webril Ace bandage.  The patient is awakened from anesthesia and brought to the postanesthesia care unit in excellent condition. All the counts were correct. No complications.

## 2018-12-30 NOTE — Anesthesia Post-Procedure Evaluation (Signed)
Procedure(s):  LEFT KNEE ARTHROSCOPIC PARTIAL MEDIAL MENISCECTOMY, CHONDROPLASTY.    general    Anesthesia Post Evaluation      Multimodal analgesia: multimodal analgesia used between 6 hours prior to anesthesia start to PACU discharge  Patient location during evaluation: PACU  Patient participation: complete - patient participated  Level of consciousness: awake and alert  Pain management: adequate  Airway patency: patent  Anesthetic complications: no  Cardiovascular status: acceptable  Respiratory status: acceptable  Hydration status: acceptable  Post anesthesia nausea and vomiting:  none      Vitals Value Taken Time   BP 117/76 12/27/2018  1:50 PM   Temp 36 ??C (96.8 ??F) 12/27/2018  1:50 PM   Pulse 58 12/27/2018  1:50 PM   Resp 25 12/27/2018  1:50 PM   SpO2 96 % 12/27/2018  1:50 PM

## 2018-12-30 NOTE — Anesthesia Post-Procedure Evaluation (Signed)
Procedure(s):  LEFT KNEE ARTHROSCOPIC PARTIAL MEDIAL MENISCECTOMY, CHONDROPLASTY.    general    Anesthesia Post Evaluation      Multimodal analgesia: multimodal analgesia used between 6 hours prior to anesthesia start to PACU discharge  Patient location during evaluation: PACU  Patient participation: complete - patient participated  Level of consciousness: awake and alert  Pain management: adequate  Airway patency: patent  Anesthetic complications: no  Cardiovascular status: acceptable  Respiratory status: acceptable  Hydration status: acceptable  Post anesthesia nausea and vomiting:  none      Vitals Value Taken Time   BP 117/76 12/27/2018  1:50 PM   Temp 36 ??C (96.8 ??F) 12/27/2018  1:50 PM   Pulse 58 12/27/2018  1:50 PM   Resp 25 12/27/2018  1:50 PM   SpO2 96 % 12/27/2018  1:50 PM

## 2018-12-31 ENCOUNTER — Encounter: Attending: Orthopaedic Surgery

## 2019-01-06 ENCOUNTER — Ambulatory Visit: Attending: Orthopaedic Surgery | Primary: Family

## 2019-01-06 ENCOUNTER — Ambulatory Visit: Admit: 2019-01-06 | Discharge: 2019-01-06 | Payer: MEDICARE | Attending: Orthopaedic Surgery

## 2019-01-06 DIAGNOSIS — S83242D Other tear of medial meniscus, current injury, left knee, subsequent encounter: Secondary | ICD-10-CM

## 2019-01-06 NOTE — Progress Notes (Signed)
HPI: George Norman is a 53 y.o. male here for 10 day follow-up from left knee partial medial meniscectomy with chondroplasty medial femoral condyle.  Patient is doing well.  Walking independently.  He states that he has been back to work.  At time surgeries found have a medial meniscus tear with grade 3 chondromalacia over the central half of the medial femoral condyle as well as grade 2 chondromalacia of the trochlea.  No fevers or chills.  No other events.      ROS: Denies fevers, chills, nausea, vomiting. Systems negative except for pertinent positives noted in HPI.    There is no problem list on file for this patient.        Current Outpatient Medications:   ???  aspirin (ASPIRIN) 325 mg tablet, Take 1 Tab by mouth daily for 30 days., Disp: 30 Tab, Rfl: 0  ???  meloxicam (MOBIC) 15 mg tablet, Take 1 Tab by mouth daily., Disp: 30 Tab, Rfl: 1  ???  diclofenac EC (VOLTAREN) 75 mg EC tablet, Take 1 Tab by mouth two (2) times a day. Take with food, Disp: 60 Tab, Rfl: 3  ???  loratadine (CLARITIN) 10 mg tablet, Take 10 mg by mouth daily., Disp: , Rfl:   ???  divalproex ER (DEPAKOTE ER) 250 mg ER tablet, 1,000 mg nightly., Disp: , Rfl: 4  ???  DAILY-VITE tablet, TAKE 1 TABLET BY MOUTH DAILY, Disp: , Rfl: 11    No Known Allergies    Physical Exam:  General: well appearing male in no acute distress    Musculoskeletal:  Physical exam left knee:  Incisions are clean and dry.  No effusion.  Patient has full extension, flexion 120??.  Slight antalgic gait with ambulation.  Stable exam of the ACL, PCL, LCL, MCL.  No redness.  Under sterile conditions, sutures are removed today.     arthroscopic photos from surgery 12/27/2018 show evidence of grade 3 chondromalacia of the central half of the medial femoral condyle, medial meniscus tear treated with partial medial meniscectomy, grade 2 chondromalacia trochlea, normal lateral compartment, normal ACL PCL    Assessment/Plan: George Norman is a 53 y.o. male postop day 7 from left  knee arthroscopic partial medial meniscectomy and chondroplasty medial femoral condyle.   patient is doing well and has been back to work.  Anti-inflammatories.  Follow up in 3 weeks for reassessment.  Does have excellent range of motion at early follow-up in will not need physical therapy.  Avoid strenuous activities.  1.  Activity modification, avoid painful in aggravating activities.  2.  Anti-inflammatories and/or analgesics as needed.  3.  All questions and concerns have been answered, patient is agreeable with this plan, call with any questions or concerns.  Lowella Dandy

## 2019-01-06 NOTE — Progress Notes (Signed)
HPI: George Norman is a 53 y.o. male here for 10 day follow-up from left knee partial medial meniscectomy with chondroplasty medial femoral condyle.  Patient is doing well.  Walking independently.  He states that he has been back to work.  At time surgeries found have a medial meniscus tear with grade 3 chondromalacia over the central half of the medial femoral condyle as well as grade 2 chondromalacia of the trochlea.  No fevers or chills.  No other events.      ROS: Denies fevers, chills, nausea, vomiting. Systems negative except for pertinent positives noted in HPI.    There is no problem list on file for this patient.        Current Outpatient Medications:   ???  aspirin (ASPIRIN) 325 mg tablet, Take 1 Tab by mouth daily for 30 days., Disp: 30 Tab, Rfl: 0  ???  meloxicam (MOBIC) 15 mg tablet, Take 1 Tab by mouth daily., Disp: 30 Tab, Rfl: 1  ???  diclofenac EC (VOLTAREN) 75 mg EC tablet, Take 1 Tab by mouth two (2) times a day. Take with food, Disp: 60 Tab, Rfl: 3  ???  loratadine (CLARITIN) 10 mg tablet, Take 10 mg by mouth daily., Disp: , Rfl:   ???  divalproex ER (DEPAKOTE ER) 250 mg ER tablet, 1,000 mg nightly., Disp: , Rfl: 4  ???  DAILY-VITE tablet, TAKE 1 TABLET BY MOUTH DAILY, Disp: , Rfl: 11    No Known Allergies    Physical Exam:  General: well appearing male in no acute distress    Musculoskeletal:  Physical exam left knee:  Incisions are clean and dry.  No effusion.  Patient has full extension, flexion 120??.  Slight antalgic gait with ambulation.  Stable exam of the ACL, PCL, LCL, MCL.  No redness.  Under sterile conditions, sutures are removed today.     arthroscopic photos from surgery 12/27/2018 show evidence of grade 3 chondromalacia of the central half of the medial femoral condyle, medial meniscus tear treated with partial medial meniscectomy, grade 2 chondromalacia trochlea, normal lateral compartment, normal ACL PCL    Assessment/Plan: George Norman is a 53 y.o. male postop day 7 from left knee  arthroscopic partial medial meniscectomy and chondroplasty medial femoral condyle.   patient is doing well and has been back to work.  Anti-inflammatories.  Follow up in 3 weeks for reassessment.  Does have excellent range of motion at early follow-up in will not need physical therapy.  Avoid strenuous activities.  1.  Activity modification, avoid painful in aggravating activities.  2.  Anti-inflammatories and/or analgesics as needed.  3.  All questions and concerns have been answered, patient is agreeable with this plan, call with any questions or concerns.  Lowella Dandy

## 2019-01-29 ENCOUNTER — Ambulatory Visit: Payer: MEDICARE | Attending: Adult Health

## 2019-02-11 NOTE — Telephone Encounter (Signed)
I spoke with the patient in regards to having to cancel his appointment due to COVID19    He wants to be put on the wait list.

## 2019-02-20 ENCOUNTER — Encounter: Payer: MEDICARE | Attending: Orthopaedic Surgery

## 2019-05-06 NOTE — Telephone Encounter (Signed)
Left a message for patient as he is on the waitlist for a post op left knee f/u     I asked patient to call back to see if he would like to see Irving Burton or Lillia Abed, if so please move him from the wait list to one of their schedules

## 2019-07-02 ENCOUNTER — Encounter: Attending: Orthopaedic Surgery

## 2019-07-10 ENCOUNTER — Ambulatory Visit: Attending: Orthopaedic Surgery | Primary: Family

## 2019-07-10 ENCOUNTER — Ambulatory Visit: Admit: 2019-07-10 | Discharge: 2019-07-10 | Payer: MEDICARE | Attending: Orthopaedic Surgery

## 2019-07-10 DIAGNOSIS — M94262 Chondromalacia, left knee: Secondary | ICD-10-CM

## 2019-07-10 MED ORDER — TRIAMCINOLONE ACETONIDE 40 MG/ML SUSP FOR INJECTION
40 mg/mL | Freq: Once | INTRAMUSCULAR | Status: AC
Start: 2019-07-10 — End: 2019-07-10
  Administered 2019-07-10: 20:00:00 via INTRA_ARTICULAR

## 2019-07-10 NOTE — Progress Notes (Signed)
HPI: George Norman is a 53 y.o. male here for follow-up evaluation of left knee.  Patient is status post left knee arthroscopic partial medial meniscectomy with chondroplasty of medial femoral condyle on 12/27/2018.  At the time of surgery patient is found have grade 3 chondromalacia over the central half of the medial femoral condyle as well as grade 2 chondromalacia of the trochlea.  He underwent partial medial meniscectomy.  He notes approximately 50% symptom relief after surgery.  He continues have 3/10 pain at times in his knee.  He does work in Biomedical [employer].  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  He will be spending when tears in [place] and leaving in October.  Denies locking or catching.  Denies swelling.  Pain is over the medial and anterior knee region.      ROS: Denies fevers, chills, nausea, vomiting. Systems negative except for pertinent positives noted in HPI.    There is no problem list on file for this patient.        Current Outpatient Medications:   ???  meloxicam (MOBIC) 15 mg tablet, Take 1 Tab by mouth daily., Disp: 30 Tab, Rfl: 1  ???  diclofenac EC (VOLTAREN) 75 mg EC tablet, Take 1 Tab by mouth two (2) times a day. Take with food, Disp: 60 Tab, Rfl: 3  ???  loratadine (CLARITIN) 10 mg tablet, Take 10 mg by mouth daily., Disp: , Rfl:   ???  divalproex ER (DEPAKOTE ER) 250 mg ER tablet, 1,000 mg nightly., Disp: , Rfl: 4  ???  DAILY-VITE tablet, TAKE 1 TABLET BY MOUTH DAILY, Disp: , Rfl: 11    Current Facility-Administered Medications:   ???  triamcinolone acetonide (KENALOG-40) 40 mg/mL injection 40 mg, 40 mg, Intra artICUlar, ONCE, Rebecka Apley, MD    No Known Allergies    Physical Exam:  General: well appearing male in no acute distress    Musculoskeletal:  Physical exam left knee:  Skin is intact.  No effusion.  Patient has full extension, flexion 130??.  Tenderness palpation at the medial joint line.  No lateral joint line tenderness.  Discomfort with patellofemoral grind testing.  Stable  exam of the ACL, PCL, LCL, MCL.  Soft compartments and calf.  No pain with hip rotation.  Normal gait with ambulation.  Intact sensation light touch dorsum plantar foot.    Procedure note:  Patient received a cortisone injection in  left knee under sterile conditions with 1 cc of Kenalog and 8 cc of Marcaine under sterile conditions.  Informed consent is obtained. Risk of infection and cortisone reaction is discussed with patient.    Assessment/Plan: George Norman is a 53 y.o. 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  Patient has had 50% improvement of symptoms compared to before surgery.  Continues to have 3/10 level pain in the knee primarily at the medial and anterior knee region.  Patient received a cortisone injection today.  I did discuss with patient that unfortunately his chondromalacia is a progressive process I will only worsen time.  The only long-term solution is knee replacement surgery.  The only benefit of arthroscopic surgeries debridement procedure but does not help pain coming from arthritis and degenerative changes.  Anti-inflammatories, activity modifications.  Follow-up when he returns from [place] which will be in 2021. We can repeat cortisone injections every 3 months as long as it is helpful.  Plan to maximize non operative treatment.  The next step surgically would be knee replacement surgery.  1.  Activity modification, avoid painful in aggravating activities.   2.  Anti-inflammatories and/or analgesics as needed.  3.  All questions and concerns have been answered, patient is agreeable with this plan, call with any questions or concerns.

## 2019-07-10 NOTE — Progress Notes (Signed)
HPI: George Norman is a 53 y.o. male here for follow-up evaluation of left knee.  Patient is status post left knee arthroscopic partial medial meniscectomy with chondroplasty of medial femoral condyle on 12/27/2018.  At the time of surgery patient is found have grade 3 chondromalacia over the central half of the medial femoral condyle as well as grade 2 chondromalacia of the trochlea.  He underwent partial medial meniscectomy.  He notes approximately 50% symptom relief after surgery.  He continues have 3/10 pain at times in his knee.  He does work in Biomedical scientist.  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  He will be spending when tears in [place] and leaving in October.  Denies locking or catching.  Denies swelling.  Pain is over the medial and anterior knee region.      ROS: Denies fevers, chills, nausea, vomiting. Systems negative except for pertinent positives noted in HPI.    There is no problem list on file for this patient.        Current Outpatient Medications:   ???  meloxicam (MOBIC) 15 mg tablet, Take 1 Tab by mouth daily., Disp: 30 Tab, Rfl: 1  ???  diclofenac EC (VOLTAREN) 75 mg EC tablet, Take 1 Tab by mouth two (2) times a day. Take with food, Disp: 60 Tab, Rfl: 3  ???  loratadine (CLARITIN) 10 mg tablet, Take 10 mg by mouth daily., Disp: , Rfl:   ???  divalproex ER (DEPAKOTE ER) 250 mg ER tablet, 1,000 mg nightly., Disp: , Rfl: 4  ???  DAILY-VITE tablet, TAKE 1 TABLET BY MOUTH DAILY, Disp: , Rfl: 11    Current Facility-Administered Medications:   ???  triamcinolone acetonide (KENALOG-40) 40 mg/mL injection 40 mg, 40 mg, Intra artICUlar, ONCE, Rebecka Apley, MD    No Known Allergies    Physical Exam:  General: well appearing male in no acute distress    Musculoskeletal:  Physical exam left knee:  Skin is intact.  No effusion.  Patient has full extension, flexion 130??.  Tenderness palpation at the medial joint line.  No lateral joint line tenderness.  Discomfort with  patellofemoral grind testing.  Stable exam of the ACL, PCL, LCL, MCL.  Soft compartments and calf.  No pain with hip rotation.  Normal gait with ambulation.  Intact sensation light touch dorsum plantar foot.    Procedure note:  Patient received a cortisone injection in  left knee under sterile conditions with 1 cc of Kenalog and 8 cc of Marcaine under sterile conditions.  Informed consent is obtained. Risk of infection and cortisone reaction is discussed with patient.    Assessment/Plan: George Norman is a 53 y.o. 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  Patient has had 50% improvement of symptoms compared to before surgery.  Continues to have 3/10 level pain in the knee primarily at the medial and anterior knee region.  Patient received a cortisone injection today.  I did discuss with patient that unfortunately his chondromalacia is a progressive process I will only worsen time.  The only long-term solution is knee replacement surgery.  The only benefit of arthroscopic surgeries debridement procedure but does not help pain coming from arthritis and degenerative changes.  Anti-inflammatories, activity modifications.  Follow-up when he returns from [place] which will be in 2021. We can repeat cortisone injections every 3 months as long as it is helpful.  Plan to maximize non operative treatment.  The next step surgically would be knee replacement surgery.  1.  Activity modification, avoid painful in aggravating activities.   2.  Anti-inflammatories and/or analgesics as needed.  3.  All questions and concerns have been answered, patient is agreeable with this plan, call with any questions or concerns.

## 2020-09-22 IMAGING — MR MRI LUMBAR SPINE WITHOUT CONTRAST
5 series · 48 of 48 positions shown · non-contrast
Comparison: none

﻿MRI OF THE LUMBAR SPINE:
HISTORY: Slip and fall dated 09/16/20 with low back pain.
TECHNIQUE: Multisequence T1 and T2 weighted images were obtained.

[Series 1: s-c scano · coronal · 6.0mm · 1.17mm/px · 7 of 11 slices shown]
[im 1/11]
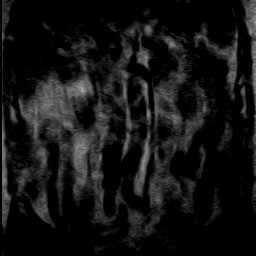
[im 2/11]
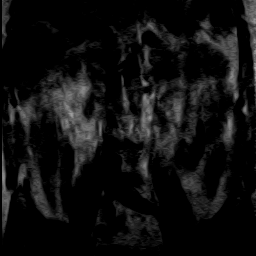
[im 4/11]
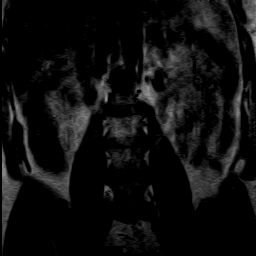
[im 6/11]
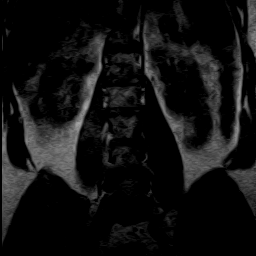
[im 7/11]
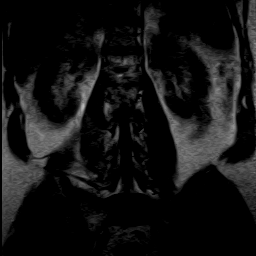
[im 9/11]
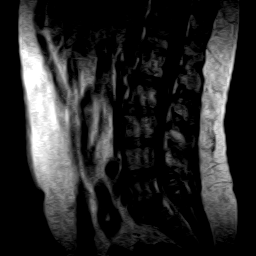
[im 11/11]
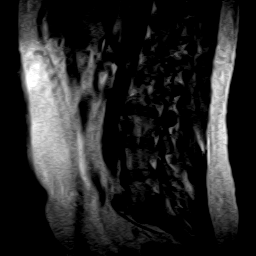

[Series 2: T2 · sagittal · 5.0mm · 1.13mm/px · 7 of 11 slices shown (1 of 2)]
[im 1/11]
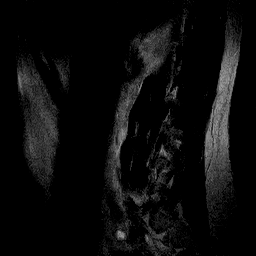
[im 2/11]
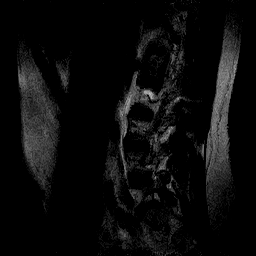
[im 4/11]
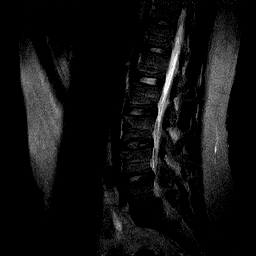
[im 6/11]
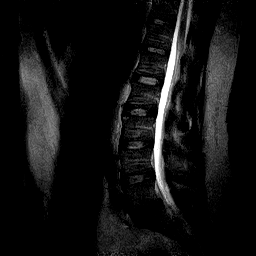
[im 7/11]
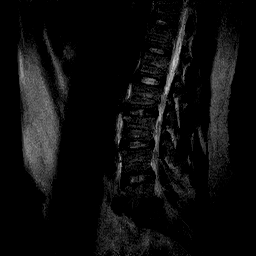
[im 9/11]
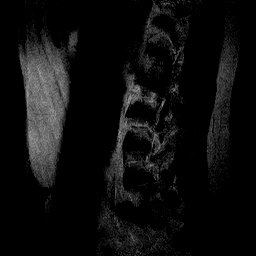
[im 11/11]
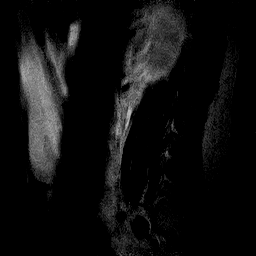

[Series 3: T1 · sagittal · 5.0mm · 1.13mm/px · 7 of 11 slices shown]
[im 1/11]
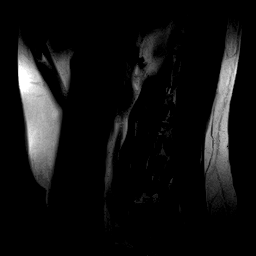
[im 2/11]
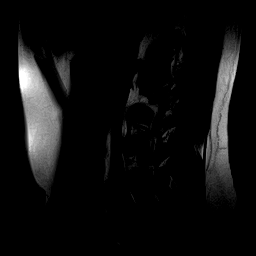
[im 4/11]
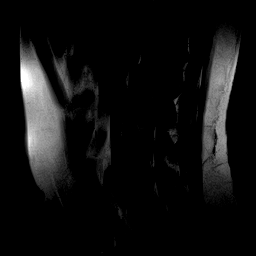
[im 6/11]
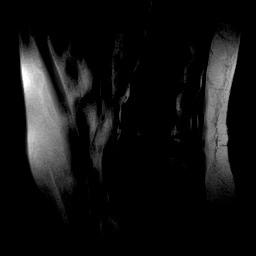
[im 7/11]
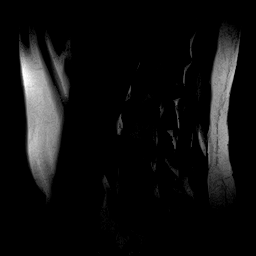
[im 9/11]
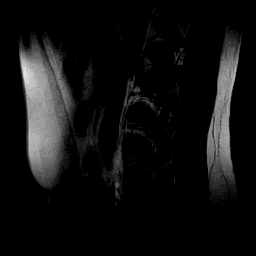
[im 11/11]
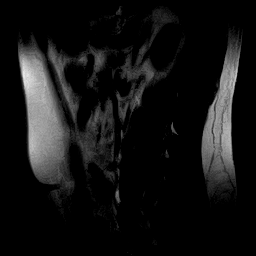

[Series 4: sag fir · sagittal · 5.0mm · 1.13mm/px · 7 of 11 slices shown]
[im 1/11]
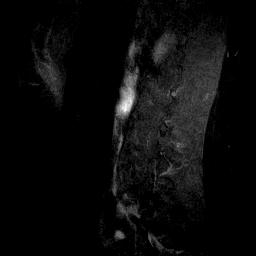
[im 2/11]
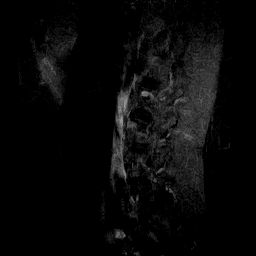
[im 4/11]
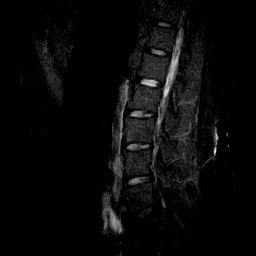
[im 6/11]
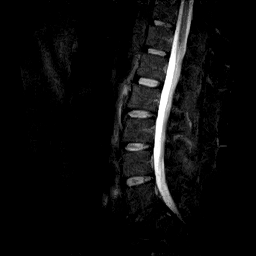
[im 7/11]
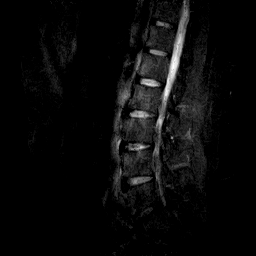
[im 9/11]
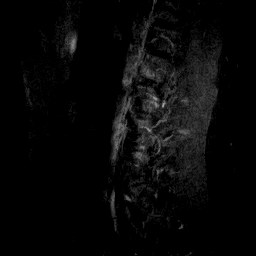
[im 11/11]
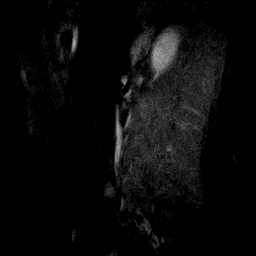

[Series 5: T2 · axial · 4.0mm · 1.02mm/px · z∈[-112,+72]mm · 20 of 32 slices shown (2 of 2)]
[im 1/32]
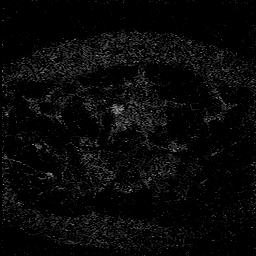
[im 2/32]
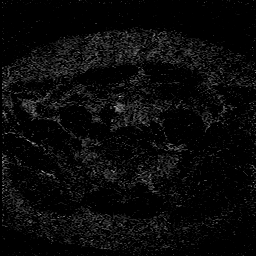
[im 4/32]
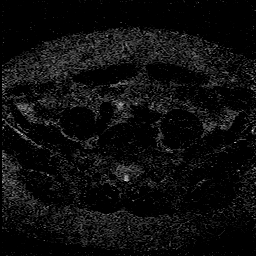
[im 5/32]
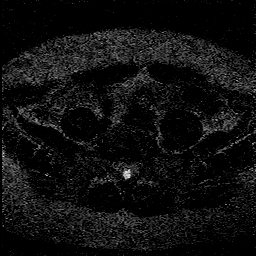
[im 7/32]
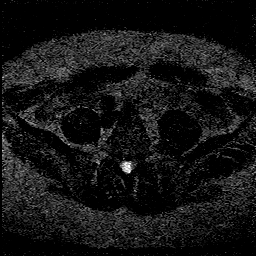
[im 9/32]
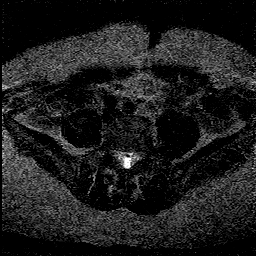
[im 10/32]
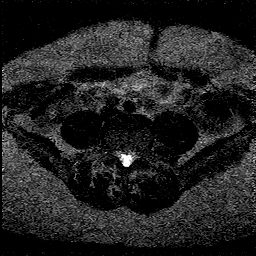
[im 12/32]
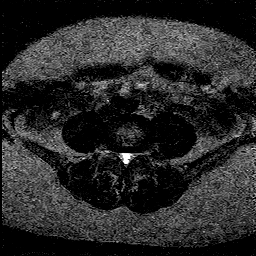
[im 14/32]
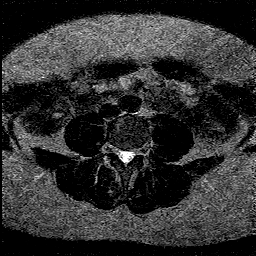
[im 15/32]
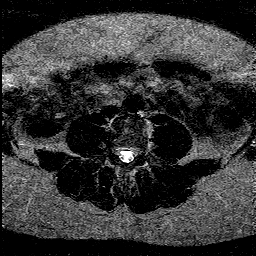
[im 17/32]
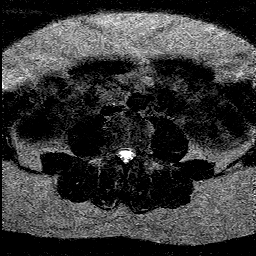
[im 18/32]
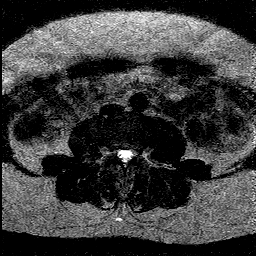
[im 20/32]
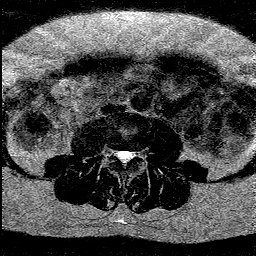
[im 22/32]
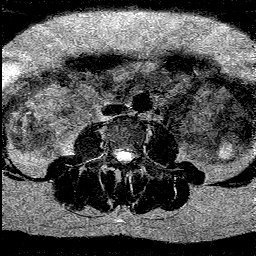
[im 23/32]
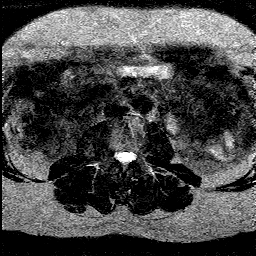
[im 25/32]
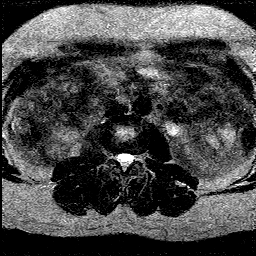
[im 27/32]
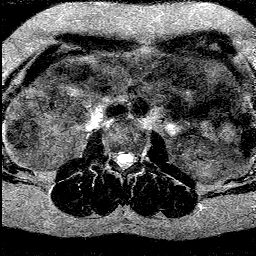
[im 28/32]
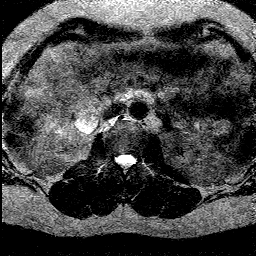
[im 30/32]
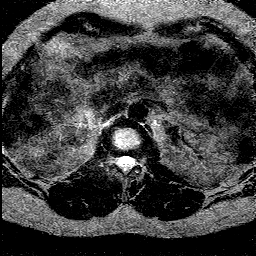
[im 32/32]
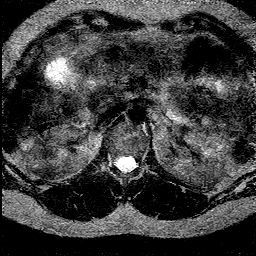

[48 of 48 positions shown; findings below may reference images not displayed]

FINDINGS: The conus medullaris appears normal.  The lordotic curvature of the lumbar spine is preserved.  No evidence for abnormal solid or cystic lesions is identified.  No prevertebral or paravertebral masses or fluid collections are seen and there is no evidence for abnormal marrow replacing lesion.  Segmental analysis of the lumbar spine is as follows:

At L1-2, there is no evidence for disc herniation, canal stenosis or neural foraminal stenosis.

At L2-3, there is no evidence for disc herniation, canal stenosis or neural foraminal stenosis.

At L3-4, there is no evidence for disc herniation, canal stenosis or neural foraminal stenosis.

At L4-5, there is bulging of the disc.  This results in an anterior impression on the thecal sac.  There is no central canal stenosis or foraminal stenosis.

At L5-S1, there is a posterior disc herniation with increased signal and an annular rupture with superior disc extrusion. There is an anterior impression on the thecal sac. There is elevation of the posterior longitudinal ligament. There is mild spinal canal stenosis with an AP dimension of 0.9 cm. There is abutment of the left and right S1 nerve roots. This is demarcated on Figure 1, image 5 of series 4. There is mild to moderate bilateral neural foraminal stenosis.
IMPRESSION: 1. At L4-5, there is bulging of the disc.  This results in an anterior impression on the thecal sac. 

2. At L5-S1, there is a posterior disc herniation with increased signal and an annular rupture with superior disc extrusion. There is an anterior impression on the thecal sac. There is elevation of the posterior longitudinal ligament. There is mild spinal canal stenosis with an AP dimension of 0.9 cm. There is abutment of the left and right S1 nerve roots. This is demarcated on Figure 1, image 5 of series 4. There is mild to moderate bilateral neural foraminal stenosis. 

3. Given the patient’s history, findings, and increased signal involving the disc herniation at L5-S1, it is medically probable that this is an acute herniation caused by the patient’s accident dated 09/16/20. Clinical correlation is recommended to confirm this. 

The definitions in this report, including definitions of disc bulge, herniation, protrusion, and extrusion, are from the following peer reviewed Jesus Antonio: Lumbar Disc Nomenclature V2.0, Recommendations of the Combined Task Forces of the [employer] Spine Society, the American Society of Spine Radiology and the American Society of Neuroradiology, The Spine [employer] [phone]. References to causation and permanency follow guidelines established by the American Medical Association. Note that a normal MRI does not exclude certain pathologies, including pathologies involving the nerves and facet joints. A normal MRI should not supersede abnormalities detected with physical exam. Disc herniations are contained herniated discs unless specifically identified as uncontained.

JCE/AC

## 2020-09-22 IMAGING — MR MRI CERVICAL SPINE WITHOUT CONTRAST
5 series · 48 of 48 positions shown · non-contrast
Comparison: none

﻿MRI OF THE CERVICAL SPINE:
HISTORY: Neck pain following a slip and fall accident dated 09/16/20.
TECHNIQUE: Multisequence T1 and T2 weighted images were obtained.

[Series 1: scano s/c · axial · 5.0mm · 1.02mm/px · z∈[-8,+130]mm · 8 of 14 slices shown]
[im 1/14]
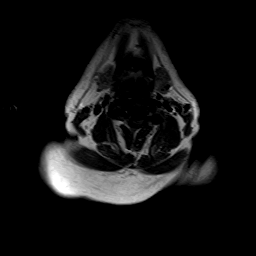
[im 2/14]
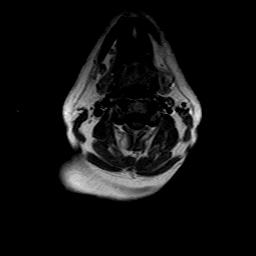
[im 4/14]
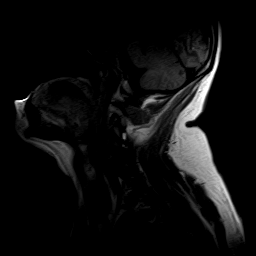
[im 6/14]
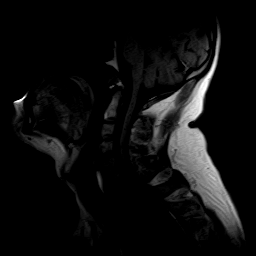
[im 8/14]
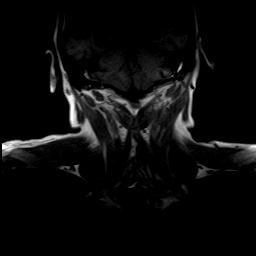
[im 10/14]
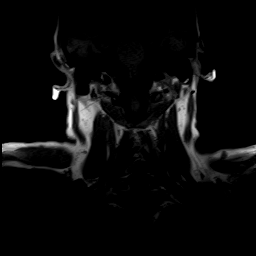
[im 12/14]
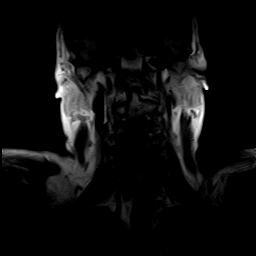
[im 14/14]
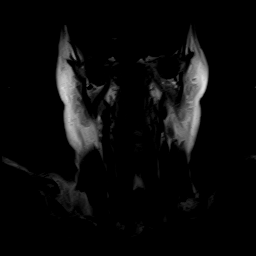

[Series 2: sag fir · sagittal · 3.0mm · 0.94mm/px · 8 of 13 slices shown]
[im 1/13]
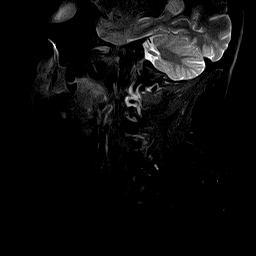
[im 2/13]
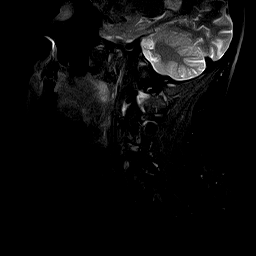
[im 4/13]
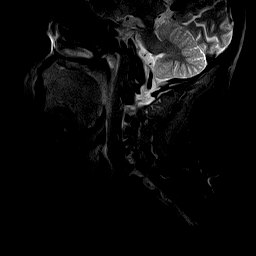
[im 6/13]
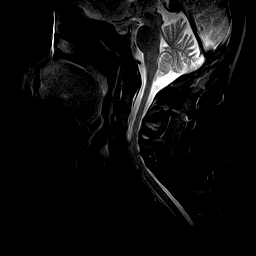
[im 7/13]
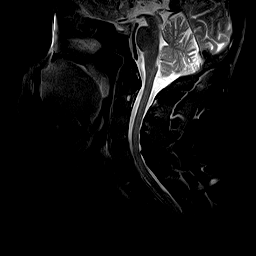
[im 9/13]
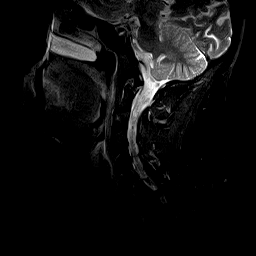
[im 11/13]
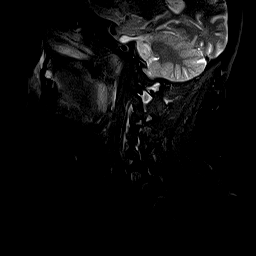
[im 13/13]
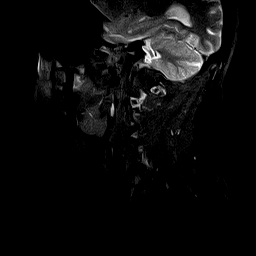

[Series 3: T2 · sagittal · 3.0mm · 0.94mm/px · 8 of 13 slices shown (1 of 2)]
[im 1/13]
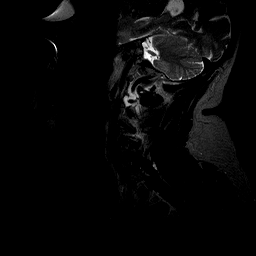
[im 2/13]
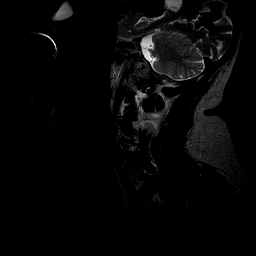
[im 4/13]
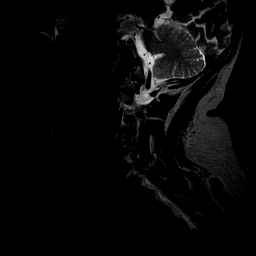
[im 6/13]
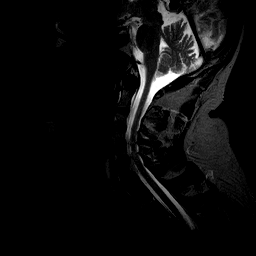
[im 7/13]
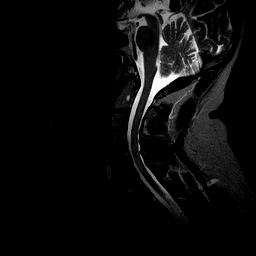
[im 9/13]
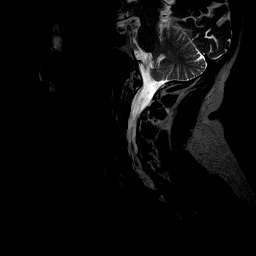
[im 11/13]
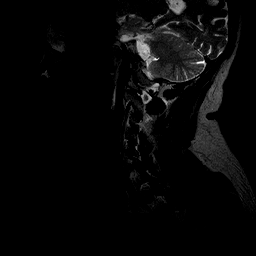
[im 13/13]
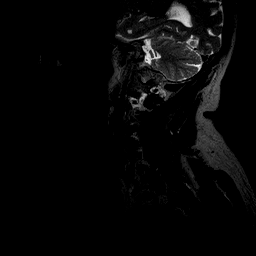

[Series 4: T2 · axial · 3.0mm · 0.94mm/px · z∈[-107,-9]mm · 16 of 26 slices shown (2 of 2)]
[im 1/26]
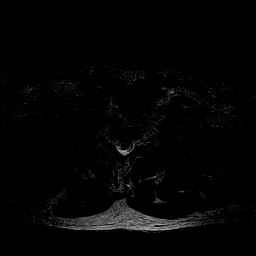
[im 2/26]
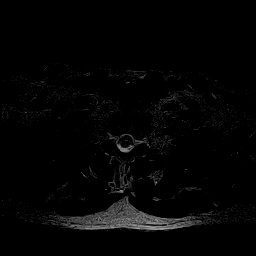
[im 4/26]
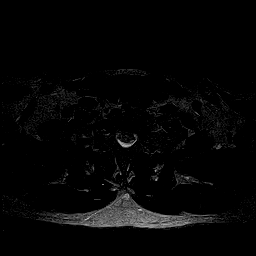
[im 6/26]
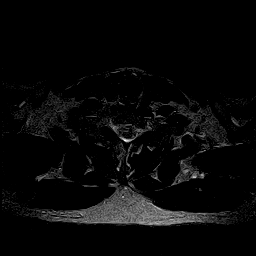
[im 7/26]
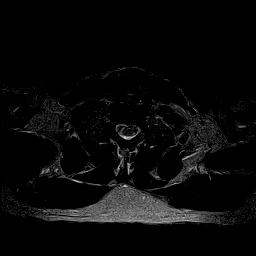
[im 9/26]
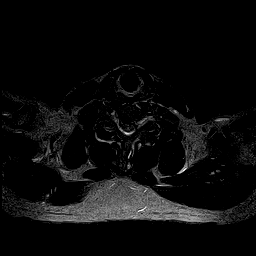
[im 11/26]
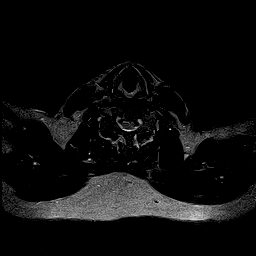
[im 12/26]
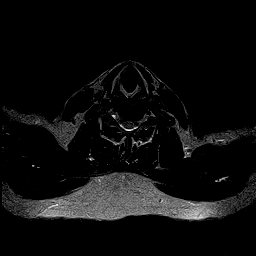
[im 14/26]
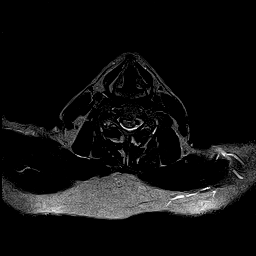
[im 16/26]
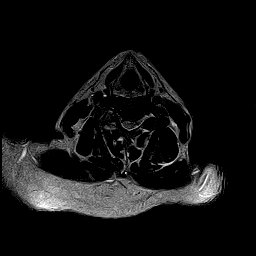
[im 17/26]
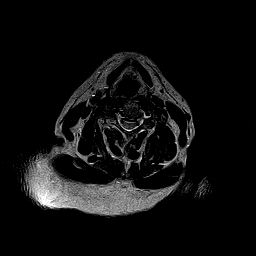
[im 19/26]
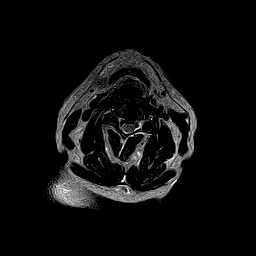
[im 21/26]
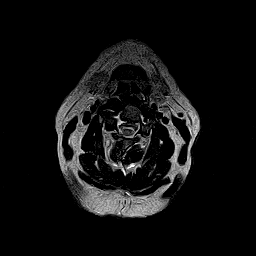
[im 22/26]
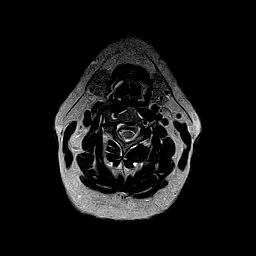
[im 24/26]
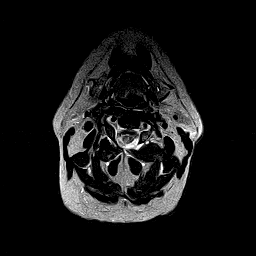
[im 26/26]
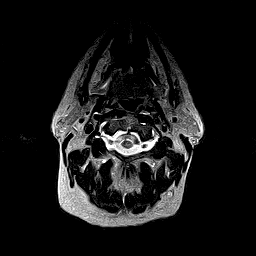

[Series 5: T1 · sagittal · 3.0mm · 0.94mm/px · 8 of 13 slices shown]
[im 1/13]
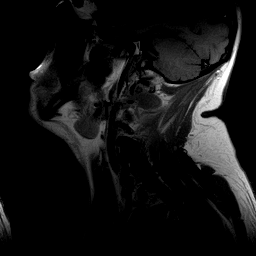
[im 2/13]
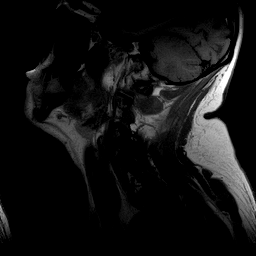
[im 4/13]
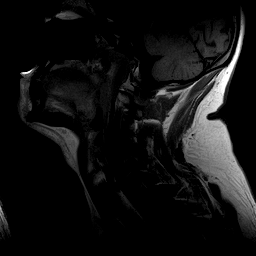
[im 6/13]
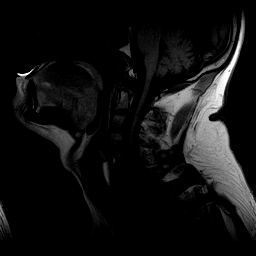
[im 7/13]
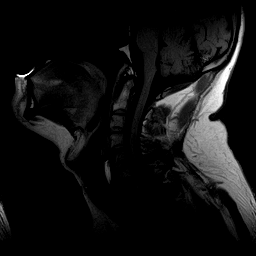
[im 9/13]
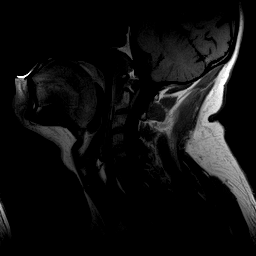
[im 11/13]
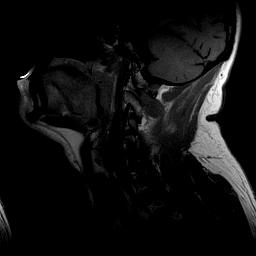
[im 13/13]
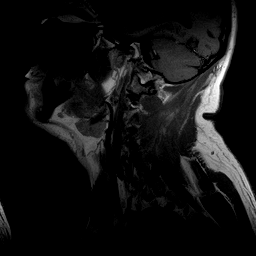

[48 of 48 positions shown; findings below may reference images not displayed]

FINDINGS: The posterior fossa structures are normal.  The cervical cord structures are normal.  The lordotic curvature is preserved.  No prevertebral or paravertebral masses or fluid collections are identified.  Segmental analysis of the cervical spine is as follows:  

At C2-3, there is no evidence for disc herniation, canal stenosis or neural foraminal stenosis.

At C3-4, there is no evidence for disc herniation, canal stenosis or neural foraminal stenosis.

At C4-5, there is a posterior disc herniation with increased signal.  There is abutment of the spinal cord.  There is mild spinal canal stenosis with an AP dimension of 0.9 cm.  This is demarcated on Figure 1, image 11 of series 4.  The left and right neural foramina are patent.  

At C5-6, there is a posterior disc herniation with increased signal extending more towards the right.  There is abutment of the spinal cord.  There is mild spinal canal stenosis with an AP dimension of 0.9 cm.  There is mild right neural foraminal stenosis.  The left neural foramen is patent.  This is demarcated on Figure 2, image 7 of series 3. 

At C6-7, there is bulging of the disc.  This results in an anterior impression on the thecal sac.  There is no central canal stenosis or foraminal stenosis.  

At C7-T1, there is no evidence for disc herniation, canal stenosis or neural foraminal stenosis.
IMPRESSION: 1. At C4-5, there is a posterior disc herniation with increased signal.  There is abutment of the spinal cord.  There is mild spinal canal stenosis with an AP dimension of 0.9 cm.  This is demarcated on Figure 1, image 11 of series [DATE]. At C5-6, there is a posterior disc herniation with increased signal extending more towards the right.  There is abutment of the spinal cord.  There is mild spinal canal stenosis with an AP dimension of 0.9 cm.  There is mild right neural foraminal stenosis.  This is demarcated on Figure 2, image 7 of series [DATE]. At C6-7, there is bulging of the disc.  This results in an anterior impression on the thecal sac.  

4. Given the patient’s history, findings, and increased signal involving the disc herniations at the levels of C4-5 and C5-6, it is medically probable that these are acute disc herniations caused by the patient’s recent slip and fall accident dated 09/16/20.  Clinical correlation is recommended to confirm this. 

The definitions in this report, including definitions of disc bulge, herniation, protrusion, and extrusion, are from the following peer reviewed Karlene: Lumbar Disc Nomenclature V2.0, Recommendations of the Combined Task Forces of the [employer] Sp[employer], the American Society of Spine Radiology and the American Society of Neuroradiology, The Spine Payal 14 [phone]. References to causation and permanency follow guidelines established by the American Medical Association. Note that a normal MRI does not exclude certain pathologies, including pathologies involving the nerves and facet joints. A normal MRI should not supersede abnormalities detected with physical exam. Disc herniations are contained herniated discs unless specifically identified as uncontained.

## 2021-12-07 IMAGING — MR MRI HIP LEFT W/O CONTRAST
4 of 7 series · 19 of 40 positions shown · non-contrast
Comparison: none

﻿MRI OF THE LEFT HIP:
HISTORY: Patient with history of slip and fall injury dated 09/16/2020 with left hip pain.
TECHNIQUE: Multisequence T1 and T2 weighted images were obtained.

[Series 1: 3-plane loc · axial · 8.0mm · 0.78mm/px · z∈[-39,+200]mm · 3 of 21 slices shown]
[im 1/21]
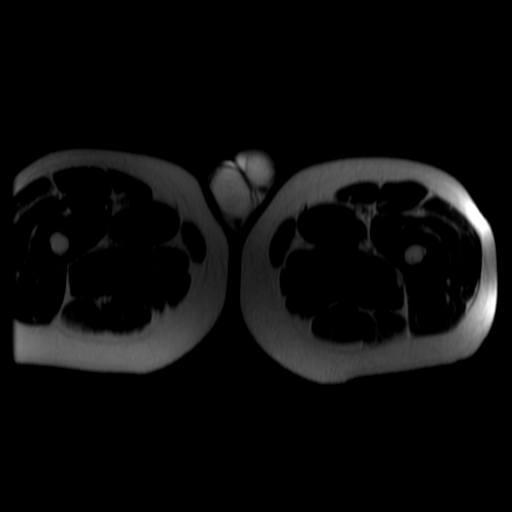
[im 11/21]
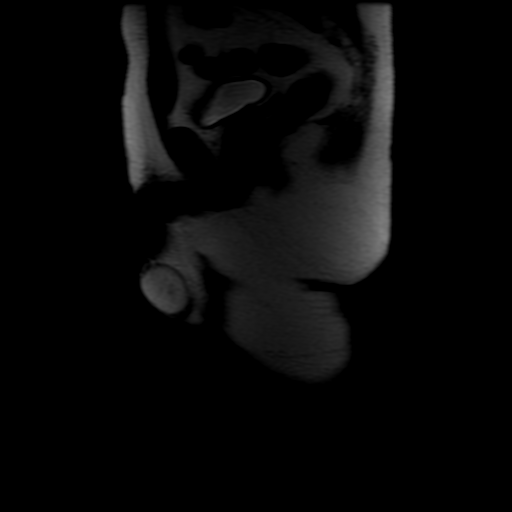
[im 21/21]
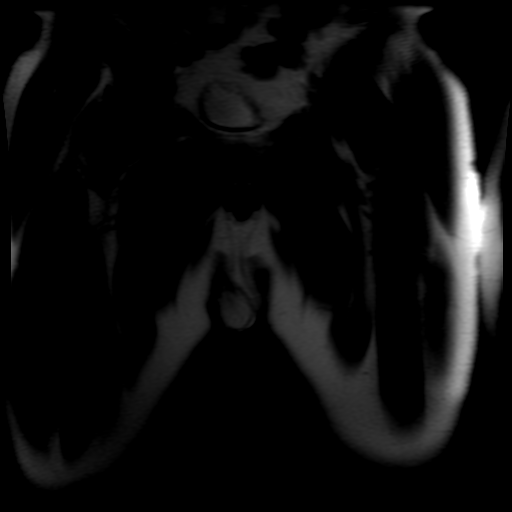

[Series 3: T1 · coronal · 6.0mm · 0.74mm/px · 4 of 18 slices shown]
[im 1/18]
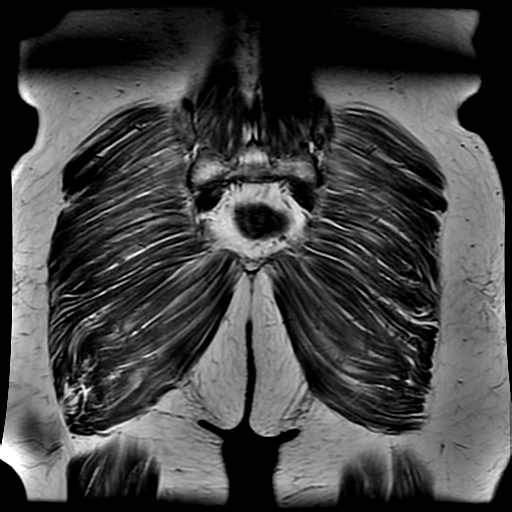
[im 4/18]
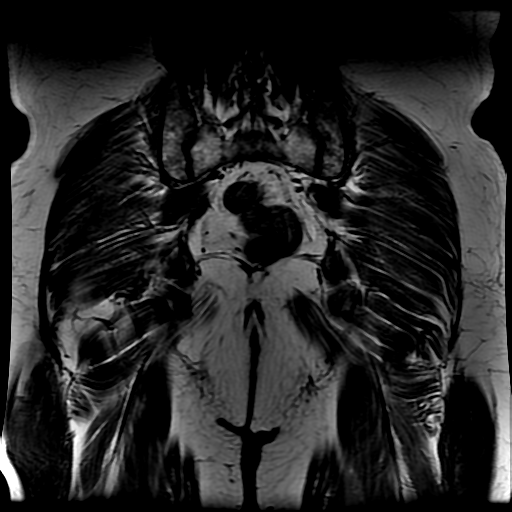
[im 11/18]
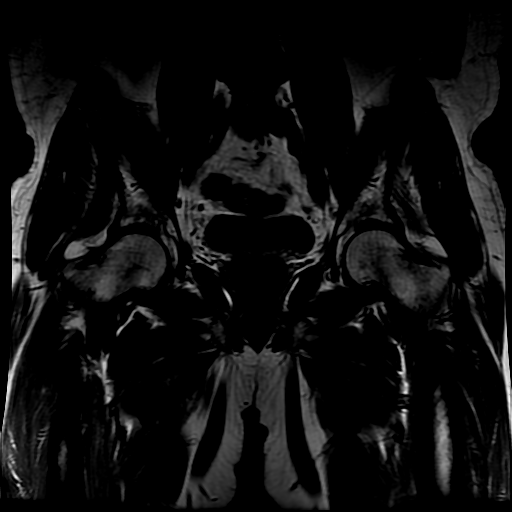
[im 18/18]
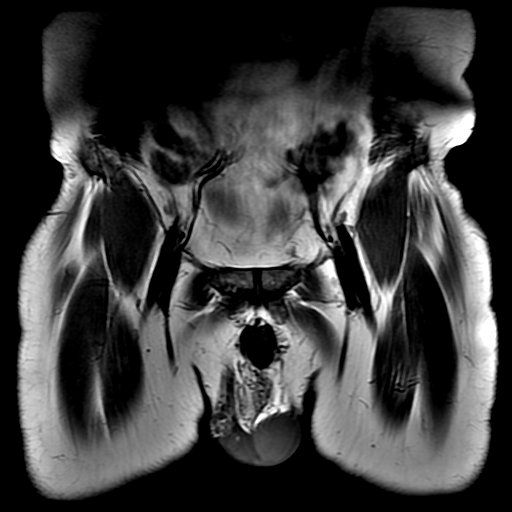

[Series 6: T2 fat-sat · axial · 4.0mm · 0.47mm/px · z∈[+114,+184]mm · 6 of 18 slices shown (1 of 2)]
[im 1/18]
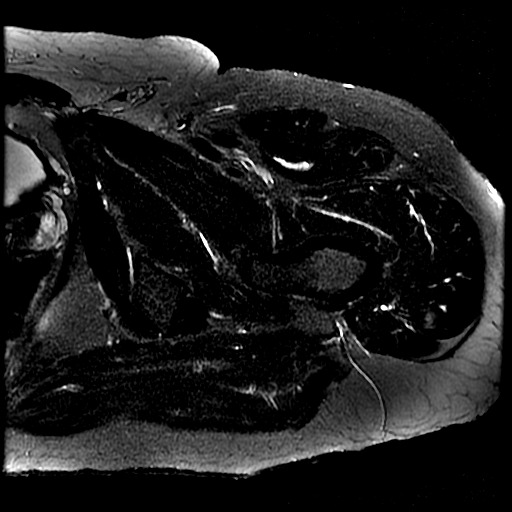
[im 4/18]
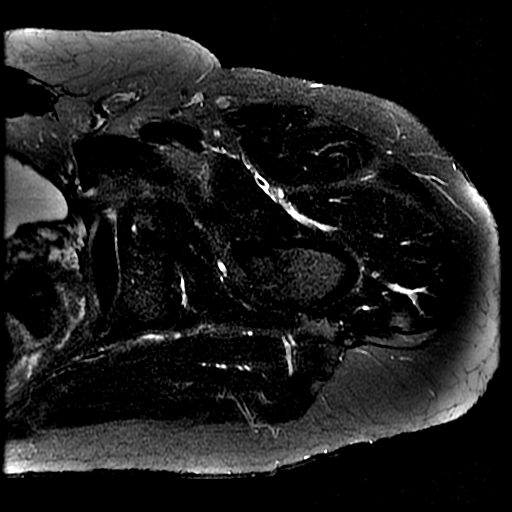
[im 7/18]
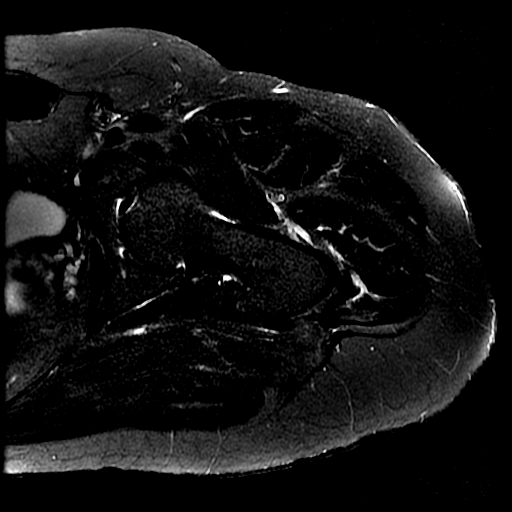
[im 11/18]
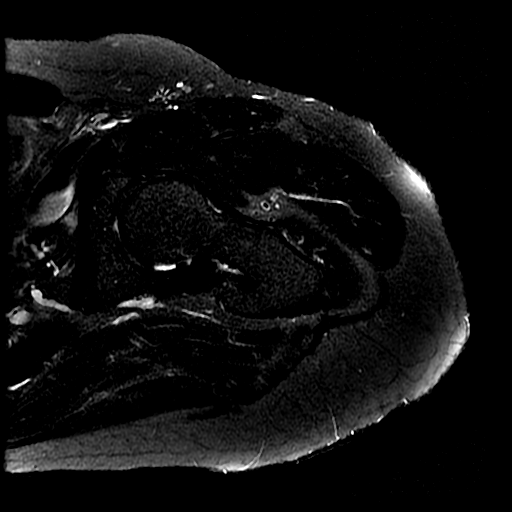
[im 14/18]
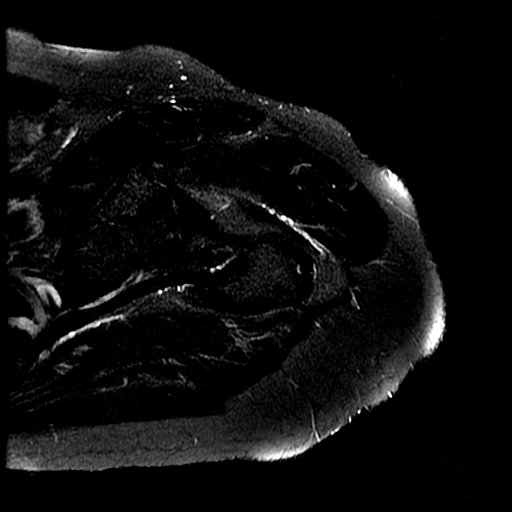
[im 18/18]
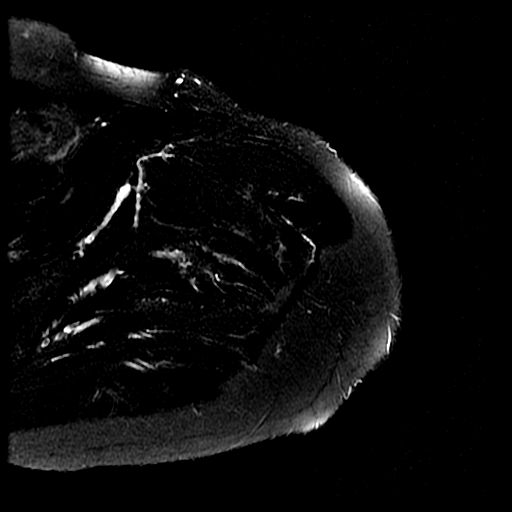

[Series 7: T2 fat-sat · axial · 4.0mm · 0.47mm/px · z∈[+56,+151]mm · 6 of 20 slices shown (2 of 2)]
[im 1/20]
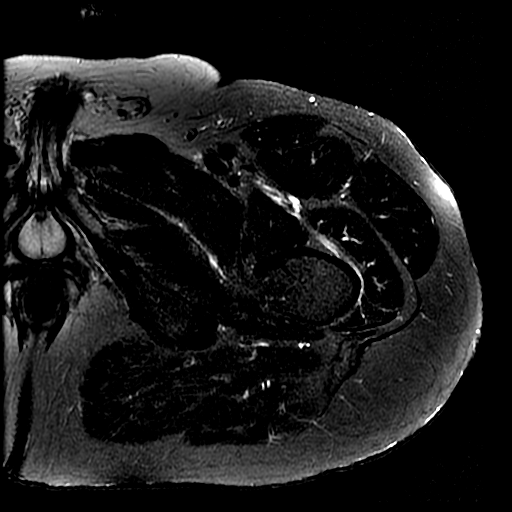
[im 4/20]
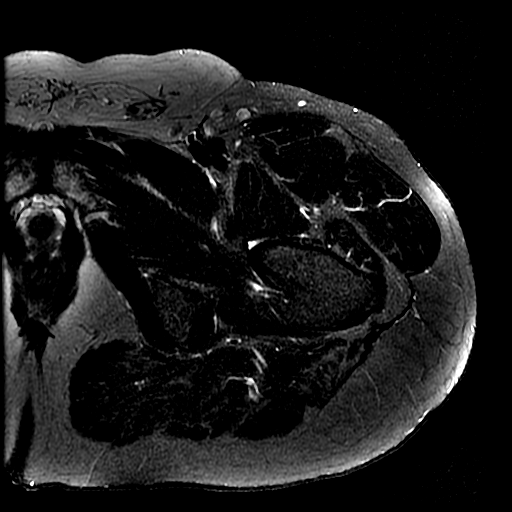
[im 8/20]
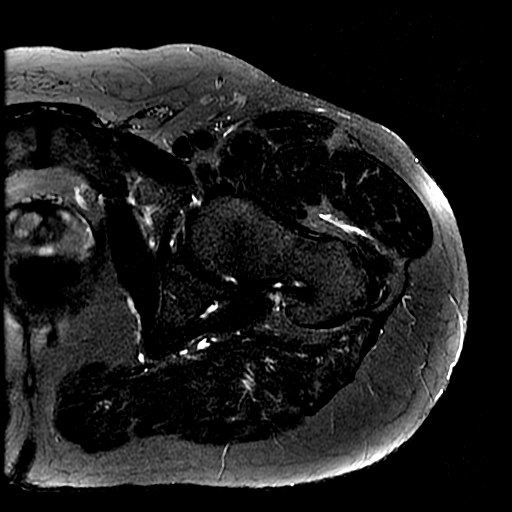
[im 12/20]
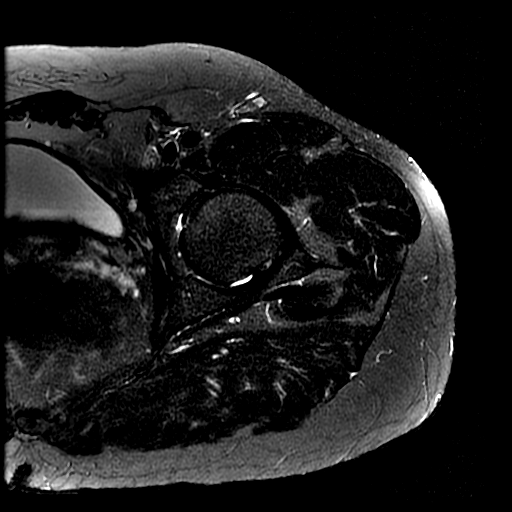
[im 16/20]
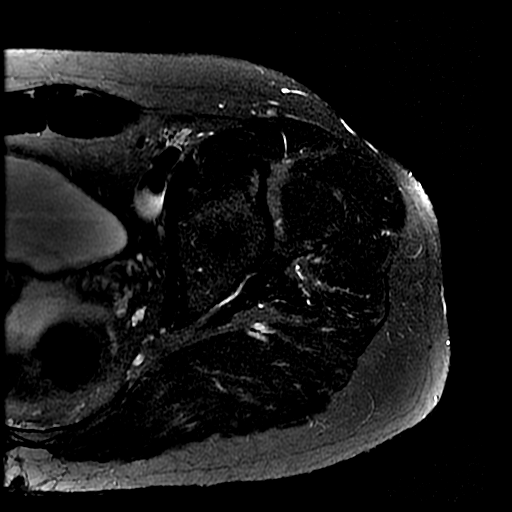
[im 20/20]
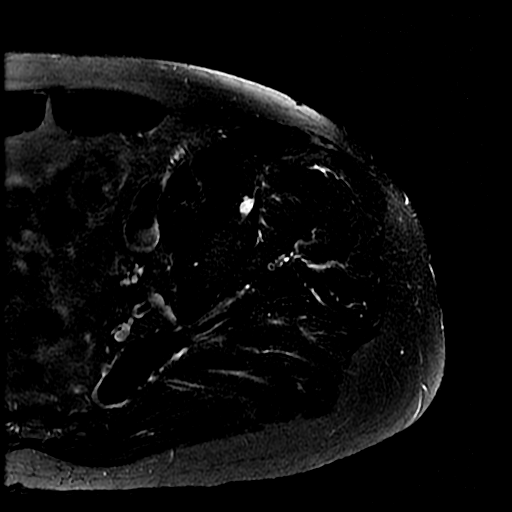

[19 of 40 positions shown; findings below may reference images not displayed]

FINDINGS: The bone marrow signal intensity is normal. The alignment is anatomic. There is a joint effusion present. The visualized labrum is intact. The left femoral head is intact and demonstrates no evidence of fragmentation or collapse. 

The muscles and soft tissues surrounding the left hip are normal in morphology and signal characteristics. There is no evidence of a focal cystic or solid lesion.

The visualized SI joints, pubic symphysis, and contralateral right hip are intact. There is also a joint effusion present in the right hip.
IMPRESSION: 1. Bilateral joint effusions in the hips.

2. Otherwise, unremarkable MRI of the left hip.
